# Patient Record
Sex: Female | Born: 1935 | Race: White | Hispanic: No | Marital: Married | Smoking: Never smoker
Health system: Southern US, Community
[De-identification: ages and names within clinical notes are randomized; demographics above are authoritative.]

## PROBLEM LIST (undated history)

## (undated) DIAGNOSIS — G459 Transient cerebral ischemic attack, unspecified: Secondary | ICD-10-CM

## (undated) DIAGNOSIS — I639 Cerebral infarction, unspecified: Secondary | ICD-10-CM

## (undated) DIAGNOSIS — C801 Malignant (primary) neoplasm, unspecified: Secondary | ICD-10-CM

## (undated) DIAGNOSIS — F0390 Unspecified dementia without behavioral disturbance: Secondary | ICD-10-CM

## (undated) HISTORY — PX: BREAST SURGERY: SHX581

## (undated) HISTORY — PX: ABDOMINAL HYSTERECTOMY: SHX81

## (undated) HISTORY — PX: CATARACT EXTRACTION: SUR2

---

## 2005-02-09 ENCOUNTER — Ambulatory Visit: Payer: Self-pay | Admitting: Internal Medicine

## 2005-03-17 ENCOUNTER — Ambulatory Visit: Payer: Self-pay | Admitting: Oncology

## 2006-03-24 ENCOUNTER — Ambulatory Visit: Payer: Self-pay | Admitting: Internal Medicine

## 2006-04-06 ENCOUNTER — Ambulatory Visit: Payer: Self-pay | Admitting: Oncology

## 2006-06-15 ENCOUNTER — Ambulatory Visit: Payer: Self-pay | Admitting: Gastroenterology

## 2007-03-28 ENCOUNTER — Ambulatory Visit: Payer: Self-pay | Admitting: Internal Medicine

## 2007-04-05 ENCOUNTER — Ambulatory Visit: Payer: Self-pay | Admitting: Oncology

## 2007-04-11 ENCOUNTER — Ambulatory Visit: Payer: Self-pay | Admitting: Oncology

## 2007-06-20 ENCOUNTER — Ambulatory Visit: Payer: Self-pay | Admitting: Internal Medicine

## 2008-03-11 ENCOUNTER — Ambulatory Visit: Payer: Self-pay | Admitting: Oncology

## 2008-04-01 ENCOUNTER — Ambulatory Visit: Payer: Self-pay | Admitting: Internal Medicine

## 2008-04-04 ENCOUNTER — Ambulatory Visit: Payer: Self-pay | Admitting: Oncology

## 2008-04-10 ENCOUNTER — Ambulatory Visit: Payer: Self-pay | Admitting: Oncology

## 2008-04-25 ENCOUNTER — Ambulatory Visit: Payer: Self-pay | Admitting: Vascular Surgery

## 2008-06-11 ENCOUNTER — Ambulatory Visit: Payer: Self-pay | Admitting: Radiation Oncology

## 2008-07-02 ENCOUNTER — Ambulatory Visit: Payer: Self-pay | Admitting: Surgery

## 2008-07-02 ENCOUNTER — Ambulatory Visit: Payer: Self-pay | Admitting: Cardiovascular Disease

## 2008-07-02 ENCOUNTER — Other Ambulatory Visit: Payer: Self-pay

## 2008-07-05 ENCOUNTER — Ambulatory Visit: Payer: Self-pay | Admitting: Surgery

## 2008-07-11 ENCOUNTER — Ambulatory Visit: Payer: Self-pay | Admitting: Oncology

## 2008-08-11 ENCOUNTER — Ambulatory Visit: Payer: Self-pay | Admitting: Oncology

## 2008-09-10 ENCOUNTER — Ambulatory Visit: Payer: Self-pay | Admitting: Oncology

## 2008-10-11 ENCOUNTER — Ambulatory Visit: Payer: Self-pay | Admitting: Oncology

## 2008-11-11 ENCOUNTER — Ambulatory Visit: Payer: Self-pay | Admitting: Oncology

## 2008-12-09 ENCOUNTER — Ambulatory Visit: Payer: Self-pay | Admitting: Oncology

## 2009-03-11 ENCOUNTER — Ambulatory Visit: Payer: Self-pay | Admitting: Oncology

## 2009-03-28 ENCOUNTER — Ambulatory Visit: Payer: Self-pay | Admitting: Oncology

## 2009-04-02 ENCOUNTER — Ambulatory Visit: Payer: Self-pay | Admitting: Internal Medicine

## 2009-04-10 ENCOUNTER — Ambulatory Visit: Payer: Self-pay | Admitting: Oncology

## 2009-05-11 ENCOUNTER — Ambulatory Visit: Payer: Self-pay | Admitting: Oncology

## 2009-06-05 ENCOUNTER — Ambulatory Visit: Payer: Self-pay | Admitting: Oncology

## 2009-06-11 ENCOUNTER — Ambulatory Visit: Payer: Self-pay | Admitting: Oncology

## 2009-09-10 ENCOUNTER — Ambulatory Visit: Payer: Self-pay | Admitting: Oncology

## 2009-09-17 ENCOUNTER — Ambulatory Visit: Payer: Self-pay | Admitting: Oncology

## 2009-09-24 ENCOUNTER — Ambulatory Visit: Payer: Self-pay | Admitting: Oncology

## 2009-10-11 ENCOUNTER — Ambulatory Visit: Payer: Self-pay | Admitting: Oncology

## 2010-03-11 ENCOUNTER — Ambulatory Visit: Payer: Self-pay | Admitting: Oncology

## 2010-03-25 ENCOUNTER — Ambulatory Visit: Payer: Self-pay | Admitting: Oncology

## 2010-04-08 ENCOUNTER — Ambulatory Visit: Payer: Self-pay | Admitting: Internal Medicine

## 2010-04-10 ENCOUNTER — Ambulatory Visit: Payer: Self-pay | Admitting: Oncology

## 2011-03-26 ENCOUNTER — Ambulatory Visit: Payer: Self-pay | Admitting: Oncology

## 2011-04-05 ENCOUNTER — Ambulatory Visit: Payer: Self-pay | Admitting: Oncology

## 2011-04-11 ENCOUNTER — Ambulatory Visit: Payer: Self-pay | Admitting: Oncology

## 2011-05-18 ENCOUNTER — Ambulatory Visit: Payer: Self-pay | Admitting: Internal Medicine

## 2011-10-14 ENCOUNTER — Ambulatory Visit: Payer: Self-pay | Admitting: Oncology

## 2011-10-14 LAB — CBC CANCER CENTER
Basophil %: 0.4 %
Eosinophil %: 4 %
HCT: 36.1 % (ref 35.0–47.0)
HGB: 12.4 g/dL (ref 12.0–16.0)
Lymphocyte %: 32.7 %
Monocyte %: 6.7 %
Neutrophil #: 3 x10 3/mm (ref 1.4–6.5)
Neutrophil %: 56.2 %
RBC: 3.74 10*6/uL — ABNORMAL LOW (ref 3.80–5.20)
RDW: 13.2 % (ref 11.5–14.5)
WBC: 5.3 x10 3/mm (ref 3.6–11.0)

## 2011-10-14 LAB — COMPREHENSIVE METABOLIC PANEL
Alkaline Phosphatase: 76 U/L (ref 50–136)
Calcium, Total: 9.6 mg/dL (ref 8.5–10.1)
Chloride: 104 mmol/L (ref 98–107)
Co2: 30 mmol/L (ref 21–32)
EGFR (African American): >60
EGFR (Non-African Amer.): 54 — ABNORMAL LOW
Glucose: 104 mg/dL — ABNORMAL HIGH (ref 65–99)
Osmolality: 281 (ref 275–301)
Sodium: 141 mmol/L (ref 136–145)

## 2011-11-12 ENCOUNTER — Ambulatory Visit: Payer: Self-pay | Admitting: Oncology

## 2012-03-10 ENCOUNTER — Ambulatory Visit: Payer: Self-pay | Admitting: Internal Medicine

## 2012-05-25 ENCOUNTER — Ambulatory Visit: Payer: Self-pay | Admitting: Oncology

## 2012-05-31 ENCOUNTER — Ambulatory Visit: Payer: Self-pay | Admitting: Internal Medicine

## 2012-06-15 ENCOUNTER — Ambulatory Visit: Payer: Self-pay | Admitting: Surgery

## 2012-06-22 ENCOUNTER — Ambulatory Visit: Payer: Self-pay | Admitting: Oncology

## 2012-07-07 LAB — PATHOLOGY REPORT

## 2012-07-11 ENCOUNTER — Ambulatory Visit: Payer: Self-pay | Admitting: Oncology

## 2012-07-24 ENCOUNTER — Ambulatory Visit: Payer: Self-pay | Admitting: Surgery

## 2012-07-24 LAB — CBC WITH DIFFERENTIAL/PLATELET
Basophil #: 0 10*3/uL (ref 0.0–0.1)
Eosinophil %: 2.4 %
HCT: 35.8 % (ref 35.0–47.0)
HGB: 12.6 g/dL (ref 12.0–16.0)
Lymphocyte #: 2 10*3/uL (ref 1.0–3.6)
Lymphocyte %: 27.5 %
MCV: 96 fL (ref 80–100)
Monocyte #: 0.4 x10 3/mm (ref 0.2–0.9)
Monocyte %: 5 %
Neutrophil #: 4.8 10*3/uL (ref 1.4–6.5)
Neutrophil %: 64.7 %
RBC: 3.75 10*6/uL — ABNORMAL LOW (ref 3.80–5.20)
RDW: 13.3 % (ref 11.5–14.5)
WBC: 7.4 10*3/uL (ref 3.6–11.0)

## 2012-07-24 LAB — BASIC METABOLIC PANEL
BUN: 10 mg/dL (ref 7–18)
Chloride: 108 mmol/L — ABNORMAL HIGH (ref 98–107)
EGFR (Non-African Amer.): 57 — ABNORMAL LOW
Glucose: 124 mg/dL — ABNORMAL HIGH (ref 65–99)
Osmolality: 285 (ref 275–301)

## 2012-08-08 ENCOUNTER — Ambulatory Visit: Payer: Self-pay | Admitting: Surgery

## 2012-08-09 LAB — CBC WITH DIFFERENTIAL/PLATELET
Basophil #: 0 10*3/uL (ref 0.0–0.1)
HGB: 10.3 g/dL — ABNORMAL LOW (ref 12.0–16.0)
Lymphocyte #: 1.7 10*3/uL (ref 1.0–3.6)
MCH: 33.9 pg (ref 26.0–34.0)
MCV: 96 fL (ref 80–100)
Monocyte #: 0.7 x10 3/mm (ref 0.2–0.9)
Neutrophil %: 76.1 %
Platelet: 83 10*3/uL — ABNORMAL LOW (ref 150–440)
RDW: 13 % (ref 11.5–14.5)
WBC: 10.3 10*3/uL (ref 3.6–11.0)

## 2012-08-10 LAB — CREATININE, SERUM: Creatinine: 0.84 mg/dL (ref 0.60–1.30)

## 2012-08-21 ENCOUNTER — Ambulatory Visit: Payer: Self-pay | Admitting: Oncology

## 2012-09-10 ENCOUNTER — Ambulatory Visit: Payer: Self-pay | Admitting: Oncology

## 2013-02-08 ENCOUNTER — Ambulatory Visit: Payer: Self-pay | Admitting: Oncology

## 2013-03-11 ENCOUNTER — Ambulatory Visit: Payer: Self-pay | Admitting: Oncology

## 2013-03-26 LAB — COMPREHENSIVE METABOLIC PANEL
Albumin: 3.8 g/dL (ref 3.4–5.0)
Anion Gap: 4 — ABNORMAL LOW (ref 7–16)
Bilirubin,Total: 0.5 mg/dL (ref 0.2–1.0)
Chloride: 104 mmol/L (ref 98–107)
Co2: 30 mmol/L (ref 21–32)
Creatinine: 1.06 mg/dL (ref 0.60–1.30)
EGFR (African American): 57 — ABNORMAL LOW
EGFR (Non-African Amer.): 49 — ABNORMAL LOW
Osmolality: 274 (ref 275–301)
SGOT(AST): 15 U/L (ref 15–37)
SGPT (ALT): 19 U/L (ref 12–78)
Sodium: 138 mmol/L (ref 136–145)
Total Protein: 7.2 g/dL (ref 6.4–8.2)

## 2013-03-26 LAB — CBC CANCER CENTER
Basophil %: 0.6 %
Eosinophil #: 0.5 x10 3/mm (ref 0.0–0.7)
Eosinophil %: 5.7 %
HCT: 38.4 % (ref 35.0–47.0)
Lymphocyte %: 30.6 %
MCH: 33.7 pg (ref 26.0–34.0)
MCHC: 34.9 g/dL (ref 32.0–36.0)
MCV: 96 fL (ref 80–100)
Monocyte %: 5.2 %
Neutrophil #: 5 x10 3/mm (ref 1.4–6.5)
Platelet: 105 x10 3/mm — ABNORMAL LOW (ref 150–440)
WBC: 8.7 x10 3/mm (ref 3.6–11.0)

## 2013-04-10 ENCOUNTER — Ambulatory Visit: Payer: Self-pay | Admitting: Oncology

## 2013-05-17 ENCOUNTER — Ambulatory Visit: Payer: Self-pay | Admitting: Physical Medicine and Rehabilitation

## 2013-05-29 ENCOUNTER — Ambulatory Visit: Payer: Self-pay | Admitting: Ophthalmology

## 2013-06-19 ENCOUNTER — Ambulatory Visit: Payer: Self-pay | Admitting: Ophthalmology

## 2013-09-26 ENCOUNTER — Ambulatory Visit: Payer: Self-pay | Admitting: Oncology

## 2013-09-26 LAB — COMPREHENSIVE METABOLIC PANEL
Albumin: 3.6 g/dL (ref 3.4–5.0)
Alkaline Phosphatase: 71 U/L
Anion Gap: 8 (ref 7–16)
BUN: 15 mg/dL (ref 7–18)
Bilirubin,Total: 0.5 mg/dL (ref 0.2–1.0)
Calcium, Total: 10.1 mg/dL (ref 8.5–10.1)
Creatinine: 1.01 mg/dL (ref 0.60–1.30)
EGFR (Non-African Amer.): 52 — ABNORMAL LOW
Glucose: 142 mg/dL — ABNORMAL HIGH (ref 65–99)
Osmolality: 283 (ref 275–301)
SGPT (ALT): 36 U/L (ref 12–78)
Sodium: 140 mmol/L (ref 136–145)
Total Protein: 7.5 g/dL (ref 6.4–8.2)

## 2013-09-26 LAB — CBC CANCER CENTER
Basophil #: 0 x10 3/mm (ref 0.0–0.1)
Basophil %: 0.2 %
HCT: 38.6 % (ref 35.0–47.0)
Lymphocyte #: 1.3 x10 3/mm (ref 1.0–3.6)
Lymphocyte %: 12.2 %
MCH: 32.8 pg (ref 26.0–34.0)
MCV: 98 fL (ref 80–100)
Neutrophil #: 8.9 x10 3/mm — ABNORMAL HIGH (ref 1.4–6.5)
Platelet: 59 x10 3/mm — ABNORMAL LOW (ref 150–440)
RBC: 3.95 10*6/uL (ref 3.80–5.20)
RDW: 13.7 % (ref 11.5–14.5)
WBC: 10.8 x10 3/mm (ref 3.6–11.0)

## 2013-10-11 ENCOUNTER — Ambulatory Visit: Payer: Self-pay | Admitting: Oncology

## 2013-11-07 LAB — CBC CANCER CENTER
BASOS ABS: 0.1 x10 3/mm (ref 0.0–0.1)
Basophil %: 0.6 %
Eosinophil #: 0.1 x10 3/mm (ref 0.0–0.7)
Eosinophil %: 1.5 %
HCT: 39.4 % (ref 35.0–47.0)
HGB: 13.2 g/dL (ref 12.0–16.0)
LYMPHS PCT: 25.6 %
Lymphocyte #: 2.2 x10 3/mm (ref 1.0–3.6)
MCH: 32.7 pg (ref 26.0–34.0)
MCHC: 33.4 g/dL (ref 32.0–36.0)
MCV: 98 fL (ref 80–100)
Monocyte #: 0.4 x10 3/mm (ref 0.2–0.9)
Monocyte %: 4.2 %
Neutrophil #: 5.9 x10 3/mm (ref 1.4–6.5)
Neutrophil %: 68.1 %
PLATELETS: 115 x10 3/mm — AB (ref 150–440)
RBC: 4.03 10*6/uL (ref 3.80–5.20)
RDW: 14.1 % (ref 11.5–14.5)
WBC: 8.7 x10 3/mm (ref 3.6–11.0)

## 2013-11-11 ENCOUNTER — Ambulatory Visit: Payer: Self-pay | Admitting: Oncology

## 2013-12-26 ENCOUNTER — Ambulatory Visit: Payer: Self-pay | Admitting: Oncology

## 2013-12-26 LAB — COMPREHENSIVE METABOLIC PANEL
ALT: 19 U/L (ref 12–78)
Albumin: 3.9 g/dL (ref 3.4–5.0)
Alkaline Phosphatase: 61 U/L
Anion Gap: 6 — ABNORMAL LOW (ref 7–16)
BUN: 18 mg/dL (ref 7–18)
Bilirubin,Total: 0.4 mg/dL (ref 0.2–1.0)
CREATININE: 1.01 mg/dL (ref 0.60–1.30)
Calcium, Total: 10.2 mg/dL — ABNORMAL HIGH (ref 8.5–10.1)
Chloride: 105 mmol/L (ref 98–107)
Co2: 31 mmol/L (ref 21–32)
EGFR (African American): >60
EGFR (Non-African Amer.): 52 — ABNORMAL LOW
Glucose: 132 mg/dL — ABNORMAL HIGH (ref 65–99)
Osmolality: 287 (ref 275–301)
POTASSIUM: 4.2 mmol/L (ref 3.5–5.1)
SGOT(AST): 14 U/L — ABNORMAL LOW (ref 15–37)
Sodium: 142 mmol/L (ref 136–145)
TOTAL PROTEIN: 7.1 g/dL (ref 6.4–8.2)

## 2013-12-26 LAB — CBC CANCER CENTER
BASOS PCT: 0.2 %
Basophil #: 0 x10 3/mm (ref 0.0–0.1)
Eosinophil #: 0 x10 3/mm (ref 0.0–0.7)
Eosinophil %: 0.1 %
HCT: 40.2 % (ref 35.0–47.0)
HGB: 13.3 g/dL (ref 12.0–16.0)
LYMPHS PCT: 24.7 %
Lymphocyte #: 2.2 x10 3/mm (ref 1.0–3.6)
MCH: 32.3 pg (ref 26.0–34.0)
MCHC: 33.1 g/dL (ref 32.0–36.0)
MCV: 98 fL (ref 80–100)
Monocyte #: 0.7 x10 3/mm (ref 0.2–0.9)
Monocyte %: 7.6 %
Neutrophil #: 6 x10 3/mm (ref 1.4–6.5)
Neutrophil %: 67.4 %
PLATELETS: 139 x10 3/mm — AB (ref 150–440)
RBC: 4.12 10*6/uL (ref 3.80–5.20)
RDW: 13.1 % (ref 11.5–14.5)
WBC: 8.9 x10 3/mm (ref 3.6–11.0)

## 2014-01-09 ENCOUNTER — Ambulatory Visit: Payer: Self-pay | Admitting: Oncology

## 2014-12-23 ENCOUNTER — Ambulatory Visit: Payer: Self-pay | Admitting: Internal Medicine

## 2015-01-21 ENCOUNTER — Ambulatory Visit
Admit: 2015-01-21 | Disposition: A | Discharge status: Home / Self Care | Payer: Self-pay | Attending: Oncology | Admitting: Oncology

## 2015-01-21 LAB — CBC CANCER CENTER
Basophil #: 0 x10 3/mm (ref 0.0–0.1)
Basophil %: 0.5 %
EOS ABS: 0.2 x10 3/mm (ref 0.0–0.7)
Eosinophil %: 3 %
HCT: 37.7 % (ref 35.0–47.0)
HGB: 12.6 g/dL (ref 12.0–16.0)
Lymphocyte #: 1.6 x10 3/mm (ref 1.0–3.6)
Lymphocyte %: 31 %
MCH: 31.8 pg (ref 26.0–34.0)
MCHC: 33.3 g/dL (ref 32.0–36.0)
MCV: 96 fL (ref 80–100)
MONO ABS: 0.3 x10 3/mm (ref 0.2–0.9)
Monocyte %: 6.5 %
Neutrophil #: 3 x10 3/mm (ref 1.4–6.5)
Neutrophil %: 59 %
PLATELETS: 71 x10 3/mm — AB (ref 150–440)
RBC: 3.95 10*6/uL (ref 3.80–5.20)
RDW: 13.9 % (ref 11.5–14.5)
WBC: 5.1 x10 3/mm (ref 3.6–11.0)

## 2015-01-21 LAB — COMPREHENSIVE METABOLIC PANEL
AST: 22 U/L
Albumin: 4 g/dL
Alkaline Phosphatase: 73 U/L
Anion Gap: 5 — ABNORMAL LOW (ref 7–16)
BUN: 14 mg/dL
Bilirubin,Total: 0.5 mg/dL
CALCIUM: 9.9 mg/dL
CO2: 28 mmol/L
Chloride: 105 mmol/L
Creatinine: 1.02 mg/dL — ABNORMAL HIGH
EGFR (Non-African Amer.): 50 — ABNORMAL LOW
GFR CALC AF AMER: 59 — AB
GLUCOSE: 110 mg/dL — AB
Potassium: 4.5 mmol/L
SGPT (ALT): 16 U/L
Sodium: 138 mmol/L
Total Protein: 6.5 g/dL

## 2015-01-28 NOTE — Discharge Summary (Signed)
PATIENT NAME:  Savannah, Lee MR#:  [id number] DATE OF BIRTH:  Jul 05, 1929  DATE OF ADMISSION:  08/08/2012 DATE OF DISCHARGE:  08/10/2012   BRIEF HISTORY: Savannah Lee is an 81 year old woman with recently discovered left breast carcinoma. She had a previously treated right breast carcinoma. She elected to proceed with bilateral mastectomy. She was taken to surgery on the morning of 08/08/2012 where she underwent a left simple mastectomy with an attempt at sentinel lymph node biopsy. Sentinel lymph node could not be identified, however, two large lymph nodes in the axilla were easily identified and sent for permanent section. A right simple mastectomy was then accomplished. She had no significant intraoperative complications. Postoperatively she had some mild problems with pain control and nausea. She was very unsteady on her feet. We elected to keep her over the course of the evening following surgery. The next day she was up ambulating better but still mildly nauseated and we observed her through a second evening. This morning she is up tolerating a regular diet. Her wounds look good. There is no sign of any infection. She has moderate drainage in her JP drains and will be sent home with her drains in place.   DISCHARGE MEDICATIONS:  1. Benadryl 25 mg p.o. at bedtime p.r.n.  2. Caltrate 600 mg p.o. b.i.d.  3. Centrum Silver one p.o. daily.  4. Vitamin B12 5000 mcg p.o. daily.  5. Diazepam 5 mg p.o. daily p.r.n.  6. She will also be discharged on Vicodin 325/5 mg p.o. p.r.n.   FINAL DISCHARGE DIAGNOSIS: Breast carcinoma.  SURGERY: Bilateral mastectomy and left axillary lymph node biopsy.  ____________________________ Rodena Goldmann III, MD rle:drc D: 08/10/2012 08:54:08 ET T: 08/10/2012 10:25:16 ET JOB#: [id number]  cc: Micheline Maze, MD, <Dictator> Leona Carry. Hall Busing, MD Rodena Goldmann MD ELECTRONICALLY SIGNED 08/11/2012 18:14

## 2015-01-28 NOTE — Op Note (Signed)
PATIENT NAME:  Savannah Lee, Savannah Lee MR#:  [id number] DATE OF BIRTH:  02/07/1929  DATE OF PROCEDURE:  08/08/2012  PREOPERATIVE DIAGNOSIS: Left breast carcinoma. Previous right breast carcinoma.   POSTOPERATIVE DIAGNOSIS: Left breast carcinoma. Previous right breast carcinoma.    PROCEDURE: Bilateral simple mastectomy, left axillary lymph node biopsy.   SURGEON: Rodena Goldmann, M.D.   ANESTHESIA: General.   OPERATIVE PROCEDURE: With the patient in the supine position after the induction of appropriate general anesthesia, the patient's chest was prepped with ChloraPrep and draped with sterile towels. The left side was approached first. Flaps were created in a fishmouth fashion around the nipple. Sutures using 3-0 silk were used to provide traction on the flaps and the superior flap created down to the second intercostal space and the chest wall. Inferior flap was created just down below the inframammary fold. The breast was then swept off the chest wall from the sternum to the latissimus muscle. The axilla was not entered. The axilla was interrogated with the Neoprobe. No significant activity could be identified in the axilla. One very large lymph node was identified just into the axilla and it was sent as a separate specimen but did not contain radionuclide material. There was some activity in the tail of the breast, but no specific lymph node could be identified. The drains were placed using 7 mm flat Jackson-Pratt drains through the inferior flap, one to the axilla and one to the superior flap. Drain was secured with 3-0 nylon. The skin was then closed with interrupted vertical mattress sutures of 4-0 nylon. A sterile drape was then placed over the wound. Operative team then reglowned, gloved and reprepped. The right side was then approached for a simple mastectomy. An elliptical incision was made around the breast, again in a fishmouth fashion. Sutures of 3-0 silk were used to secure the flaps for traction and  the superior flap created down to the second intercostal space and the inferior flap down just below the inframammary fold. The breast was again swept off the chest wall using Bovie electrocautery. Major perforating vessels were individually ligated with 3-0 Vicryl. The specimen was taken from the sternal edge to the latissimus dorsi muscle. The axilla was not entered. The specimen was marked and sent for permanent section. The area was copiously irrigated. Hemostasis appeared to be satisfactory. Two drains of a 7 mm flat Jackson-Pratt size were inserted through the inferior flap, placed one to the axilla and one to the superior flap. The skin was then secured with 4-0 nylon vertical mattress sutures and the drain was secured with 3-0 nylon. Sterile dressings were applied. The patient was returned to the recovery room having tolerated the procedure well. Sponge, instrument, and needle counts were correct x2 in the Operating Room.   ____________________________ Micheline Maze, MD rle:ap D: 08/08/2012 13:46:57 ET T: 08/08/2012 14:24:39 ET JOB#: [id number]  cc: Micheline Maze, MD, <Dictator> Leona Carry. Hall Busing, MD Rodena Goldmann MD ELECTRONICALLY SIGNED 08/11/2012 18:14

## 2015-01-31 NOTE — Op Note (Signed)
PATIENT NAME:  Savannah Lee, Savannah Lee MR#:  [id number] DATE OF BIRTH:  1929/08/30  DATE OF PROCEDURE:  06/19/2013  PREOPERATIVE DIAGNOSIS: Visually significant cataract of the left eye.   POSTOPERATIVE DIAGNOSIS: Visually significant cataract of the left eye.   OPERATIVE PROCEDURE: Cataract extraction by phacoemulsification with implant of intraocular lens to left eye.   SURGEON: Birder Robson, MD.   ANESTHESIA: 1. Managed anesthesia care.  2. Topical tetracaine drops followed by 2% Xylocaine jelly applied in the preoperative holding area.   COMPLICATIONS: None.   TECHNIQUE:  Stop and chop.    DESCRIPTION OF PROCEDURE: The patient was examined and consented in the preoperative holding area where the aforementioned topical anesthesia was applied to the left eye and then brought back to the Operating Room where the left eye was prepped and draped in the usual sterile ophthalmic fashion and a lid speculum was placed. A paracentesis was created with the side port blade and the anterior chamber was filled with viscoelastic. A near clear corneal incision was performed with the steel keratome. A continuous curvilinear capsulorrhexis was performed with a cystotome followed by the capsulorrhexis forceps. Hydrodissection and hydrodelineation were carried out with BSS on a blunt cannula. The lens was removed in a stop and chop  technique and the remaining cortical material was removed with the irrigation-aspiration handpiece. The capsular bag was inflated with viscoelastic and the Alcon SN60WF ZCB00, 23.0-diopter lens, serial number [id number] was placed in the capsular bag without complication. The remaining viscoelastic was removed from the eye with the irrigation-aspiration handpiece. The wounds were hydrated. The anterior chamber was flushed with Miostat and the eye was inflated to physiologic pressure. 0.1 mL of cefuroxime concentration 10 mg/mL was placed in the anterior chamber. The wounds were found to be  water tight. The eye was dressed with Vigamox. The patient was given protective glasses to wear throughout the day and a shield with which to sleep tonight. The patient was also given drops with which to begin a drop regimen today and will follow-up with me in 1 day.   ____________________________ Livingston Diones. Gertude Benito, MD wlp:cs D: 06/19/2013 17:34:01 ET T: 06/19/2013 20:02:04 ET JOB#: [id number]  cc: Michall Noffke L. Larue Lightner, MD, <Dictator> Livingston Diones Shataria Crist MD ELECTRONICALLY SIGNED 07/02/2013 16:54

## 2015-01-31 NOTE — Op Note (Signed)
PATIENT NAME:  Savannah Lee, Savannah Lee MR#:  [id number] DATE OF BIRTH:  1929-06-19  DATE OF PROCEDURE:  05/29/2013  PREOPERATIVE DIAGNOSIS: Visually significant cataract of the right eye.   POSTOPERATIVE DIAGNOSIS: Visually significant cataract of the right eye.   OPERATIVE PROCEDURE: Cataract extraction by phacoemulsification with implant of intraocular lens to right eye.   SURGEON: Birder Robson, MD.   ANESTHESIA:  1. Managed anesthesia care.  2. Topical tetracaine drops followed by 2% Xylocaine jelly applied in the preoperative holding area.   COMPLICATIONS: None.   TECHNIQUE:  Stop and chop.  DESCRIPTION OF PROCEDURE: The patient was examined and consented in the preoperative holding area where the aforementioned topical anesthesia was applied to the right eye and then brought back to the Operating Room where the right eye was prepped and draped in the usual sterile ophthalmic fashion and a lid speculum was placed. A paracentesis was created with the side port blade and the anterior chamber was filled with viscoelastic. A near clear corneal incision was performed with the steel keratome. A continuous curvilinear capsulorrhexis was performed with a cystotome followed by the capsulorrhexis forceps. Hydrodissection and hydrodelineation were carried out with BSS on a blunt cannula. The lens was removed in a stop and chop technique and the remaining cortical material was removed with the irrigation-aspiration handpiece. The capsular bag was inflated with viscoelastic and the TECNIS ZCB00 22.5-diopter lens, serial number [id number] was placed in the capsular bag without complication. The remaining viscoelastic was removed from the eye with the irrigation-aspiration handpiece. The wounds were hydrated. The anterior chamber was flushed with Miostat and the eye was inflated to physiologic pressure. 0.1 mL of cefuroxime concentration 10 mg/mL was placed in the anterior chamber. The wounds were found to be water  tight. The eye was dressed with Vigamox. The patient was given protective glasses to wear throughout the day and a shield with which to sleep tonight. The patient was also given drops with which to begin a drop regimen today and will follow-up with me in one day.   Please note, a single 10-0 nylon stitch was placed through the main incision at the end of the case due to iris prolapse in the last stages of the operation. The iris was gently reposited within the eye, and a stitch was placed. The patient was made aware of both the iris prolapse and the placement of the stitch.    ____________________________ Livingston Diones. Vanita Cannell, MD wlp:mr D: 05/29/2013 14:27:26 ET T: 05/29/2013 19:24:33 ET JOB#: [id number]  cc: Deronda Christian L. Odell Choung, MD, <Dictator> Livingston Diones Litzy Dicker MD ELECTRONICALLY SIGNED 05/30/2013 13:38

## 2015-08-12 ENCOUNTER — Emergency Department: Payer: Medicare Other

## 2015-08-12 ENCOUNTER — Inpatient Hospital Stay (HOSPITAL_COMMUNITY)
Admit: 2015-08-12 | Discharge: 2015-08-12 | Disposition: A | Discharge status: Home / Self Care | Payer: Medicare Other | Attending: Internal Medicine | Admitting: Internal Medicine

## 2015-08-12 ENCOUNTER — Inpatient Hospital Stay
Admission: EM | Admit: 2015-08-12 | Discharge: 2015-08-14 | DRG: 066 | Disposition: A | Discharge status: Home / Self Care | Payer: Medicare Other | Attending: Internal Medicine | Admitting: Internal Medicine

## 2015-08-12 ENCOUNTER — Encounter: Payer: Self-pay | Admitting: Emergency Medicine

## 2015-08-12 DIAGNOSIS — Z90710 Acquired absence of both cervix and uterus: Secondary | ICD-10-CM | POA: Diagnosis not present

## 2015-08-12 DIAGNOSIS — Z9849 Cataract extraction status, unspecified eye: Secondary | ICD-10-CM

## 2015-08-12 DIAGNOSIS — E785 Hyperlipidemia, unspecified: Secondary | ICD-10-CM | POA: Diagnosis present

## 2015-08-12 DIAGNOSIS — F418 Other specified anxiety disorders: Secondary | ICD-10-CM | POA: Diagnosis present

## 2015-08-12 DIAGNOSIS — I10 Essential (primary) hypertension: Secondary | ICD-10-CM | POA: Diagnosis present

## 2015-08-12 DIAGNOSIS — I517 Cardiomegaly: Secondary | ICD-10-CM | POA: Diagnosis not present

## 2015-08-12 DIAGNOSIS — F0390 Unspecified dementia without behavioral disturbance: Secondary | ICD-10-CM | POA: Diagnosis present

## 2015-08-12 DIAGNOSIS — I639 Cerebral infarction, unspecified: Secondary | ICD-10-CM | POA: Diagnosis present

## 2015-08-12 DIAGNOSIS — Z66 Do not resuscitate: Secondary | ICD-10-CM | POA: Diagnosis present

## 2015-08-12 DIAGNOSIS — Z809 Family history of malignant neoplasm, unspecified: Secondary | ICD-10-CM | POA: Diagnosis not present

## 2015-08-12 DIAGNOSIS — I671 Cerebral aneurysm, nonruptured: Secondary | ICD-10-CM | POA: Diagnosis present

## 2015-08-12 DIAGNOSIS — W06XXXA Fall from bed, initial encounter: Secondary | ICD-10-CM | POA: Diagnosis present

## 2015-08-12 DIAGNOSIS — R9389 Abnormal findings on diagnostic imaging of other specified body structures: Secondary | ICD-10-CM

## 2015-08-12 DIAGNOSIS — Z8673 Personal history of transient ischemic attack (TIA), and cerebral infarction without residual deficits: Secondary | ICD-10-CM | POA: Diagnosis not present

## 2015-08-12 DIAGNOSIS — G894 Chronic pain syndrome: Secondary | ICD-10-CM | POA: Diagnosis present

## 2015-08-12 DIAGNOSIS — Z79899 Other long term (current) drug therapy: Secondary | ICD-10-CM | POA: Diagnosis not present

## 2015-08-12 HISTORY — DX: Unspecified dementia, unspecified severity, without behavioral disturbance, psychotic disturbance, mood disturbance, and anxiety: F03.90

## 2015-08-12 HISTORY — DX: Transient cerebral ischemic attack, unspecified: G45.9

## 2015-08-12 HISTORY — DX: Malignant (primary) neoplasm, unspecified: C80.1

## 2015-08-12 LAB — CBC
HCT: 38.8 % (ref 35.0–47.0)
HEMOGLOBIN: 13.3 g/dL (ref 12.0–16.0)
MCH: 33.5 pg (ref 26.0–34.0)
MCHC: 34.2 g/dL (ref 32.0–36.0)
MCV: 98 fL (ref 80.0–100.0)
PLATELETS: 124 10*3/uL — AB (ref 150–440)
RBC: 3.96 MIL/uL (ref 3.80–5.20)
RDW: 13.8 % (ref 11.5–14.5)
WBC: 5.9 10*3/uL (ref 3.6–11.0)

## 2015-08-12 LAB — COMPREHENSIVE METABOLIC PANEL
ALK PHOS: 49 U/L (ref 38–126)
ALT: 15 U/L (ref 14–54)
ANION GAP: 8 (ref 5–15)
AST: 23 U/L (ref 15–41)
Albumin: 4.2 g/dL (ref 3.5–5.0)
BILIRUBIN TOTAL: 1 mg/dL (ref 0.3–1.2)
BUN: 8 mg/dL (ref 6–20)
CALCIUM: 10.1 mg/dL (ref 8.9–10.3)
CO2: 25 mmol/L (ref 22–32)
Chloride: 108 mmol/L (ref 101–111)
Creatinine, Ser: 0.88 mg/dL (ref 0.44–1.00)
GFR, EST NON AFRICAN AMERICAN: 59 mL/min — AB (ref >60)
Glucose, Bld: 105 mg/dL — ABNORMAL HIGH (ref 65–99)
Potassium: 3.5 mmol/L (ref 3.5–5.1)
Sodium: 141 mmol/L (ref 135–145)
TOTAL PROTEIN: 6.8 g/dL (ref 6.5–8.1)

## 2015-08-12 LAB — LIPID PANEL
CHOL/HDL RATIO: 3.6 ratio
Cholesterol: 226 mg/dL — ABNORMAL HIGH (ref 0–200)
HDL: 63 mg/dL (ref >40)
LDL CALC: 134 mg/dL — AB (ref 0–99)
Triglycerides: 146 mg/dL (ref <150)
VLDL: 29 mg/dL (ref 0–40)

## 2015-08-12 LAB — DIFFERENTIAL
Basophils Absolute: 0 10*3/uL (ref 0–0.1)
Basophils Relative: 0 %
EOS PCT: 1 %
Eosinophils Absolute: 0 10*3/uL (ref 0–0.7)
LYMPHS ABS: 1.8 10*3/uL (ref 1.0–3.6)
LYMPHS PCT: 30 %
MONO ABS: 0.4 10*3/uL (ref 0.2–0.9)
MONOS PCT: 7 %
Neutro Abs: 3.6 10*3/uL (ref 1.4–6.5)
Neutrophils Relative %: 62 %

## 2015-08-12 LAB — PROTIME-INR
INR: 0.93
PROTHROMBIN TIME: 12.7 s (ref 11.4–15.0)

## 2015-08-12 LAB — APTT: aPTT: 29 seconds (ref 24–36)

## 2015-08-12 MED ORDER — HYDROCODONE-ACETAMINOPHEN 7.5-325 MG PO TABS
0.5000 | ORAL_TABLET | Freq: Three times a day (TID) | ORAL | Status: DC | PRN
Start: 2015-08-12 — End: 2015-08-14

## 2015-08-12 MED ORDER — DIAZEPAM 5 MG PO TABS
5.0000 mg | ORAL_TABLET | Freq: Every day | ORAL | Status: DC | PRN
  Administered 2015-08-13 (×2): 5 mg via ORAL
  Filled 2015-08-12 (×2): qty 1

## 2015-08-12 MED ORDER — CALCIUM CITRATE-VITAMIN D 500-400 MG-UNIT PO CHEW
1.0000 | CHEWABLE_TABLET | Freq: Every day | ORAL | Status: DC
  Filled 2015-08-12: qty 1

## 2015-08-12 MED ORDER — CALCIUM CARBONATE-VITAMIN D 500-200 MG-UNIT PO TABS
1.0000 | ORAL_TABLET | Freq: Every day | ORAL | Status: DC
  Administered 2015-08-13 – 2015-08-14 (×2): 1 via ORAL
  Filled 2015-08-12 (×2): qty 1

## 2015-08-12 MED ORDER — DULOXETINE HCL 60 MG PO CPEP
60.0000 mg | ORAL_CAPSULE | Freq: Every day | ORAL | Status: DC
  Administered 2015-08-12 – 2015-08-13 (×2): 60 mg via ORAL
  Filled 2015-08-12 (×2): qty 1

## 2015-08-12 MED ORDER — ASPIRIN 325 MG PO TABS
325.0000 mg | ORAL_TABLET | Freq: Every day | ORAL | Status: DC
  Administered 2015-08-13 – 2015-08-14 (×2): 325 mg via ORAL
  Filled 2015-08-12 (×2): qty 1

## 2015-08-12 MED ORDER — ASPIRIN 81 MG PO CHEW
324.0000 mg | CHEWABLE_TABLET | Freq: Once | ORAL | Status: AC
  Administered 2015-08-12: 324 mg via ORAL
  Filled 2015-08-12: qty 4

## 2015-08-12 MED ORDER — ATORVASTATIN CALCIUM 20 MG PO TABS
40.0000 mg | ORAL_TABLET | Freq: Every day | ORAL | Status: DC
  Administered 2015-08-12 – 2015-08-13 (×2): 40 mg via ORAL
  Filled 2015-08-12 (×2): qty 2

## 2015-08-12 MED ORDER — DONEPEZIL HCL 5 MG PO TABS
5.0000 mg | ORAL_TABLET | Freq: Every evening | ORAL | Status: DC
  Administered 2015-08-12 – 2015-08-13 (×2): 5 mg via ORAL
  Filled 2015-08-12 (×2): qty 1

## 2015-08-12 NOTE — H&P (Signed)
[facility] NAME: Savannah Lee    MR#:  [id number]  DATE OF BIRTH:  1929-04-25  DATE OF ADMISSION:  08/12/2015  PRIMARY CARE PHYSICIAN: Dr. Benita Stabile.   REQUESTING/REFERRING PHYSICIAN: Harvest Dark  CHIEF COMPLAINT:   Chief Complaint  Patient presents with  . Numbness    HISTORY OF PRESENT ILLNESS: Savannah Lee  is a 81 y.o. female with a known history of dementia and TIA in the past who lives with husband and does not require any support, was completely fine at her baseline. Last night when she went to sleep. Today morning she woke up and wanted to go to the bathroom and had developed weakness on her right side of the body and so she was stumbling so decided to come to emergency room for that she also felt right-sided arm and leg both are numb and weak. Denies any other associated symptoms. CT scan in ER is negative for stroke. Her weakness is still persisted so ER physician started on admission for possible stroke.  PAST MEDICAL HISTORY:   Past Medical History  Diagnosis Date  . Dementia   . TIA (transient ischemic attack)     PAST SURGICAL HISTORY:  Past Surgical History  Procedure Laterality Date  . Abdominal hysterectomy    . Breast surgery    . Cataract extraction      SOCIAL HISTORY:  Social History  Substance Use Topics  . Smoking status: Never Smoker   . Smokeless tobacco: Not on file  . Alcohol Use: No    FAMILY HISTORY:  Family History  Problem Relation Age of Onset  . Cancer Father     DRUG ALLERGIES: No Known Allergies  REVIEW OF SYSTEMS:   CONSTITUTIONAL: No fever, fatigue or weakness.  EYES: No blurred or double vision.  EARS, NOSE, AND THROAT: No tinnitus or ear pain.  RESPIRATORY: No cough, shortness of breath, wheezing or hemoptysis.  CARDIOVASCULAR: No chest pain, orthopnea, edema.  GASTROINTESTINAL: No nausea, vomiting, diarrhea or abdominal pain.  GENITOURINARY: No dysuria,  hematuria.  ENDOCRINE: No polyuria, nocturia,  HEMATOLOGY: No anemia, easy bruising or bleeding SKIN: No rash or lesion. MUSCULOSKELETAL: No joint pain or arthritis.   NEUROLOGIC: No tingling, right side upper and lower extremity numbness & weakness.  PSYCHIATRY: No anxiety or depression.   MEDICATIONS AT HOME:  Prior to Admission medications   Medication Sig Start Date End Date Taking? Authorizing Provider  calcium citrate-vitamin D (CITRACAL+D) 315-200 MG-UNIT tablet Take 1 tablet by mouth daily.   Yes Historical Provider, MD  diazepam (VALIUM) 5 MG tablet Take 5 mg by mouth daily as needed for muscle spasms.    Yes Historical Provider, MD  diphenhydrAMINE (BENADRYL) 25 MG tablet Take 25 mg by mouth at bedtime as needed for allergies.    Yes Historical Provider, MD  donepezil (ARICEPT) 5 MG tablet Take 5 mg by mouth every evening.   Yes Historical Provider, MD  DULoxetine (CYMBALTA) 60 MG capsule Take 60 mg by mouth daily.   Yes Historical Provider, MD  HYDROcodone-acetaminophen (NORCO) 7.5-325 MG tablet Take 0.5-1 tablets by mouth 3 (three) times daily as needed for moderate pain or severe pain.    Yes Historical Provider, MD  Multiple Vitamins-Minerals (CENTRUM SILVER PO) Take 1 tablet by mouth daily.   Yes Historical Provider, MD  tizanidine (ZANAFLEX) 2 MG capsule Take 1-2 mg by mouth 3 (three) times daily.   Yes Historical Provider, MD  PHYSICAL EXAMINATION:   VITAL SIGNS: Blood pressure 134/76, pulse 74, temperature 98.2 F (36.8 C), temperature source Oral, resp. rate 17, height 5' (1.524 m), weight 46.267 kg (102 lb), SpO2 93 %.  GENERAL:  81 y.o.-year-old patient lying in the bed with no acute distress.  EYES: Pupils equal, round, reactive to light and accommodation. No scleral icterus. Extraocular muscles intact.  HEENT: Head atraumatic, normocephalic. Oropharynx and nasopharynx clear.  NECK:  Supple, no jugular venous distention. No thyroid enlargement, no tenderness.   LUNGS: Normal breath sounds bilaterally, no wheezing, rales,rhonchi or crepitation. No use of accessory muscles of respiration.  CARDIOVASCULAR: S1, S2 normal. No murmurs, rubs, or gallops.  ABDOMEN: Soft, nontender, nondistended. Bowel sounds present. No organomegaly or mass.  EXTREMITIES: No pedal edema, cyanosis, or clubbing.  NEUROLOGIC: Cranial nerves II through XII are intact. Muscle strength 5/5 in left side extremities, 3 to 4 out of 5 in right side extremities. Sensation intact. Gait not checked.  PSYCHIATRIC: The patient is alert and oriented x 3.  SKIN: No obvious rash, lesion, or ulcer.   LABORATORY PANEL:   CBC  Recent Labs Lab 08/12/15 1116  WBC 5.9  HGB 13.3  HCT 38.8  PLT 124*  MCV 98.0  MCH 33.5  MCHC 34.2  RDW 13.8  LYMPHSABS 1.8  MONOABS 0.4  EOSABS 0.0  BASOSABS 0.0   ------------------------------------------------------------------------------------------------------------------  Chemistries   Recent Labs Lab 08/12/15 1116  NA 141  K 3.5  CL 108  CO2 25  GLUCOSE 105*  BUN 8  CREATININE 0.88  CALCIUM 10.1  AST 23  ALT 15  ALKPHOS 49  BILITOT 1.0   ------------------------------------------------------------------------------------------------------------------ estimated creatinine clearance is 34.2 mL/min (by C-G formula based on Cr of 0.88). ------------------------------------------------------------------------------------------------------------------ No results for input(s): TSH, T4TOTAL, T3FREE, THYROIDAB in the last 72 hours.  Invalid input(s): FREET3   Coagulation profile  Recent Labs Lab 08/12/15 1116  INR 0.93   ------------------------------------------------------------------------------------------------------------------- No results for input(s): DDIMER in the last 72 hours. -------------------------------------------------------------------------------------------------------------------  Cardiac Enzymes No  results for input(s): CKMB, TROPONINI, MYOGLOBIN in the last 168 hours.  Invalid input(s): CK ------------------------------------------------------------------------------------------------------------------ Invalid input(s): POCBNP  ---------------------------------------------------------------------------------------------------------------  Urinalysis No results found for: COLORURINE, APPEARANCEUR, LABSPEC, PHURINE, GLUCOSEU, HGBUR, BILIRUBINUR, KETONESUR, PROTEINUR, UROBILINOGEN, NITRITE, LEUKOCYTESUR   RADIOLOGY: Ct Head Wo Contrast  08/12/2015  CLINICAL DATA:  Fall. Right leg weakness and numbness. Right arm numbness. EXAM: CT HEAD WITHOUT CONTRAST TECHNIQUE: Contiguous axial images were obtained from the base of the skull through the vertex without intravenous contrast. COMPARISON:  03/10/2012 FINDINGS: There is atrophy and chronic small vessel disease changes. No acute intracranial abnormality. Specifically, no hemorrhage, hydrocephalus, mass lesion, acute infarction, or significant intracranial injury. No acute calvarial abnormality. Visualized paranasal sinuses and mastoids clear. Orbital soft tissues unremarkable. IMPRESSION: No acute intracranial abnormality. Atrophy, chronic microvascular disease. Electronically Signed   By: Rolm Baptise M.D.   On: 08/12/2015 12:10    EKG:  NSR  IMPRESSION AND PLAN:  * CVA  His patient woke up with the symptoms in the morning she is out of the window period for any thrombolytic treatments. Will admit and monitor on telemetry, do frequent neuro checks.  She still have persistent weakness on right side, so we will get physical therapy for further evaluation.  Get an MRI of the brain, carotid Doppler, echocardiogram for further assessment.  Check lipid panel, and will give aspirin and statin for now.  * Dementia  Continue home medications  * Chronic pains  Continue her  home pain medications as needed.    All the records are reviewed  and case discussed with ED provider. Management plans discussed with the patient, family and they are in agreement.  CODE STATUS: DO NOT RESUSCITATE  The patient's husband is in the room I discuss about her healthcare power of attorney and her wishes CPR in his presence. I also discussed about her current possibility of stroke and need for admission and further workup stay in agreement with the plan.   TOTAL TIME TAKING CARE OF THIS PATIENT: 50 minutes.    Vaughan Basta M.D on 08/12/2015   Between 7am to 6pm - Pager - [phone]  After 6pm go to [email or web address] - password [email or web address] [email or web address]  [facility]  Office  [phone]  CC: Primary care physician; No primary care provider on file.   Note: This dictation was prepared with Dragon dictation along with smaller phrase technology. Any transcriptional errors that result from this process are unintentional.

## 2015-08-12 NOTE — ED Provider Notes (Signed)
[facility] Emergency Department Provider Note  Time seen: 12:17 PM  I have reviewed the triage vital signs and the nursing notes.   HISTORY  Chief Complaint Numbness    HPI Savannah Lee is a 82 y.o. female with no past medical history who presents to the emergency department with a right leg and arm weakness. According to the patient she awoke at 8:30 this morning, she attempted to get out of bed and fell to the ground. Her right arm and right leg felt very weak. Her last known normal was last night before going to bed. Denies any headache. States her arm and leg continue to feel weak. In her arm is tingling. Describes her weakness as moderate, tingling is moderate.     History reviewed. No pertinent past medical history.  There are no active problems to display for this patient.   Past Surgical History  Procedure Laterality Date  . Abdominal hysterectomy    . Breast surgery    . Cataract extraction      Current Outpatient Rx  Name  Route  Sig  Dispense  Refill  . calcium citrate-vitamin D (CITRACAL+D) 315-200 MG-UNIT tablet   Oral   Take 1 tablet by mouth daily.         . diazepam (VALIUM) 5 MG tablet   Oral   Take 5 mg by mouth daily as needed for muscle spasms.          . diphenhydrAMINE (BENADRYL) 25 MG tablet   Oral   Take 25 mg by mouth at bedtime as needed for allergies.          Marland Kitchen donepezil (ARICEPT) 5 MG tablet   Oral   Take 5 mg by mouth every evening.         . DULoxetine (CYMBALTA) 60 MG capsule   Oral   Take 60 mg by mouth daily.         Marland Kitchen HYDROcodone-acetaminophen (NORCO) 7.5-325 MG tablet   Oral   Take 0.5-1 tablets by mouth 3 (three) times daily as needed for moderate pain or severe pain.          . Multiple Vitamins-Minerals (CENTRUM SILVER PO)   Oral   Take 1 tablet by mouth daily.         . tizanidine (ZANAFLEX) 2 MG capsule   Oral   Take 1-2 mg by mouth 3 (three) times daily.            Allergies Review of patient's allergies indicates no known allergies.  No family history on file.  Social History Social History  Substance Use Topics  . Smoking status: Never Smoker   . Smokeless tobacco: None  . Alcohol Use: No    Review of Systems Constitutional: Negative for fever. Cardiovascular: Negative for chest pain. Respiratory: Negative for shortness of breath. Gastrointestinal: Negative for abdominal pain Musculoskeletal: Negative for back pain. Negative for neck pain. Neurological: Negative for headache. Weakness in her right arm and right leg. Tingling in her right arm. 10-point ROS otherwise negative.  ____________________________________________   PHYSICAL EXAM:  VITAL SIGNS: ED Triage Vitals  Enc Vitals Group     BP 08/12/15 1046 148/87 mmHg     Pulse Rate 08/12/15 1046 92     Resp 08/12/15 1046 18     Temp 08/12/15 1046 98.2 F (36.8 C)     Temp Source 08/12/15 1046 Oral     SpO2 08/12/15 1046 98 %  Weight 08/12/15 1046 102 lb (46.267 kg)     Height 08/12/15 1046 5' (1.524 m)     Head Cir --      Peak Flow --      Pain Score --      Pain Loc --      Pain Edu? --      Excl. in [place]? --     Constitutional: Alert and oriented. Well appearing and in no distress. Eyes: Normal exam ENT   Head: Normocephalic and atraumatic   Mouth/Throat: Mucous membranes are moist. Cardiovascular: Normal rate, regular rhythm. No murmur Respiratory: Normal respiratory effort without tachypnea nor retractions. Breath sounds are clear  Gastrointestinal: Soft and nontender. No distention.  Musculoskeletal: Nontender with normal range of motion in all extremities. Neurologic:  Normal speech and language. No facial droop. Cranial nerves intact. Moderate pronator drift of the right upper extremity. Subjective sensory deficit in the right upper extremity. Mild lower right extremity drift, no sensory deficit. Skin:  Skin is warm, dry and intact.  Psychiatric:  Mood and affect are normal. Speech and behavior are normal.   ____________________________________________    EKG  EKG reviewed and interpreted by myself shows normal sinus rhythm at 72 bpm, narrow QRS, normal axis, normal intervals, no ST changes noted. Overall normal EKG.  ____________________________________________    RADIOLOGY  CT shows no acute abnormality  ____________________________________________   INITIAL IMPRESSION / ASSESSMENT AND PLAN / ED COURSE  Pertinent labs & imaging results that were available during my care of the patient were reviewed by me and considered in my medical decision making (see chart for details).  Patient with signs and symptoms most suggestive of a stroke. Continued right and lower extremity drift. CT negative. Labs are largely within normal limits. We'll admit to the hospital for acute CVA. Dose aspirin in the emergency department.   NIH Stroke Scale    Time: 12:21 PM Person Administering Scale: Ashleigh Luckow  Administer stroke scale items in the order listed. Record performance in each category after each subscale exam. Do not go back and change scores. Follow directions provided for each exam technique. Scores should reflect what the patient does, not what the clinician thinks the patient can do. The clinician should record answers while administering the exam and work quickly. Except where indicated, the patient should not be coached (i.e., repeated requests to patient to make a special effort).   1a  Level of consciousness: 0=alert; keenly responsive  1b. LOC questions:  0=Performs both tasks correctly  1c. LOC commands: 0=Performs both tasks correctly  2.  Best Gaze: 0=normal  3.  Visual: 0=No visual loss  4. Facial Palsy: 0=Normal symmetric movement  5a.  Motor left arm: 0=No drift, limb holds 90 (or 45) degrees for full 10 seconds  5b.  Motor right arm: 1=Drift, limb holds 90 (or 45) degrees but drifts down before full 10  seconds: does not hit bed  6a. motor left leg: 0=No drift, limb holds 90 (or 45) degrees for full 10 seconds  6b  Motor right leg:  1=Drift, limb holds 90 (or 45) degrees but drifts down before full 10 seconds: does not hit bed  7. Limb Ataxia: 2=Present in two limbs  8.  Sensory: 1=Mild to moderate sensory loss; patient feels pinprick is less sharp or is dull on the affected side; there is a loss of superficial pain with pinprick but patient is aware She is being touched  9. Best Language:  0=No aphasia, normal  10. Dysarthria: 0=Normal  11. Extinction and Inattention: 0=No abnormality  12. Distal motor function: 0=Normal   Total:   5    ____________________________________________   FINAL CLINICAL IMPRESSION(S) / ED DIAGNOSES  Acute cerebrovascular accident   Harvest Dark, MD 08/12/15 1222

## 2015-08-12 NOTE — Evaluation (Signed)
Physical Therapy Evaluation Patient Details Name: Savannah Lee MRN: [id number] DOB: 12-29-1928 Today's Date: 08/12/2015   History of Present Illness  presented to ER with onset of R sided weakness/numbness; admitted for CVA work-up.  CT head negative for acute change; MRI pending.  Clinical Impression  Upon evaluation, patient alert and oriented; very pleasant and cooperative.  Eager for all mobility attempts.  Demonstrates fair strength in R UE/LE (4/5), though decreased compared to L hemi-body.  Mod coordination and fine motor deficits noted (UE > LE) with decreased sensory awareness noted with functional activities.  Patient somewhat impulsive with limited insight into deficits.  Currently requiring min assist for all functional mobility; minimally changed with use of RW (may consider trial of SPC vs. loftstrand in subsequent sessions).  Occasional LOB to R with all standing/mobiltiy requiring min assist form therapist for correction.  Would benefit from skilled PT to address above deficits and promote optimal return to PLOF; anticipate discharge to home with HHPT (vs. STR), but will assess stairs next session prior to full recommendation.    Follow Up Recommendations Home health PT;SNF (pending stair assessment next session)    Equipment Recommendations       Recommendations for Other Services       Precautions / Restrictions Precautions Precautions: Fall Restrictions Weight Bearing Restrictions: No      Mobility  Bed Mobility Overal bed mobility: Modified Independent                Transfers Overall transfer level: Needs assistance   Transfers: Sit to/from Stand Sit to Stand: Min assist         General transfer comment: impulsive  Ambulation/Gait Ambulation/Gait assistance: Min assist Ambulation Distance (Feet): 120 Feet Assistive device: None       General Gait Details: broad BOS, slightly staggering with R LE.  Decreased heel strike/toe off and overall  foot clearance to R LE; decreased R arm swing.  Min assist for balance/stability with dynamic gait components.  Stairs            Wheelchair Mobility    Modified Rankin (Stroke Patients Only)       Balance Overall balance assessment: Needs assistance Sitting-balance support: No upper extremity supported;Feet supported Sitting balance-Leahy Scale: Good     Standing balance support: No upper extremity supported Standing balance-Leahy Scale: Fair                               Pertinent Vitals/Pain Pain Assessment: No/denies pain    Home Living Family/patient expects to be discharged to:: Private residence Living Arrangements: Spouse/significant other Available Help at Discharge: Family Type of Home: House Home Access: Stairs to enter Entrance Stairs-Rails: Right Entrance Stairs-Number of Steps: 4 Home Layout: Two level;Bed/bath upstairs (has bed/bathroom on main level of home if needed) Home Equipment: None      Prior Function Level of Independence: Independent         Comments: Indep with all household/community mobility; + driving.  Denies fall history     Hand Dominance   Dominant Hand: Right    Extremity/Trunk Assessment   Upper Extremity Assessment:  (R UE strength grossly 4/5.  Decreased coordination and fine motor control; mild dysmetria and ataxia.  Decreased sensory awareness to light touch, but intact proprioception with no extinction noted.)           Lower Extremity Assessment:  (R LE strength grossly 4/5.  Decreased coordination and  RAMPS.  Decreased sensory awareness to light touch, but intact proprioception with no extinction noted.  Negative Babinski and clonus R ankle)         Communication   Communication: No difficulties  Cognition Arousal/Alertness: Awake/alert Behavior During Therapy: Impulsive Overall Cognitive Status: Within Functional Limits for tasks assessed                      General Comments       Exercises Other Exercises Other Exercises: 15' with RW, cga/min assist--poor walker position and management; patient constantly voicing dislike of RW (? greater barrier vs. help?).  May benefit from trial of loftstrand vs. SPC if any device is fully necessary.      Assessment/Plan    PT Assessment Patient needs continued PT services  PT Diagnosis Difficulty walking;Hemiplegia dominant side;Generalized weakness   PT Problem List Decreased strength;Decreased range of motion;Decreased activity tolerance;Decreased balance;Decreased mobility;Decreased coordination;Decreased knowledge of use of DME;Decreased safety awareness;Decreased knowledge of precautions;Impaired sensation  PT Treatment Interventions DME instruction;Gait training;Stair training;Functional mobility training;Therapeutic activities;Therapeutic exercise;Balance training;Neuromuscular re-education;Cognitive remediation;Patient/family education   PT Goals (Current goals can be found in the Care Plan section) Acute Rehab PT Goals Patient Stated Goal: to move around a little bit PT Goal Formulation: With patient Time For Goal Achievement: 08/26/15 Potential to Achieve Goals: Good    Frequency 7X/week   Barriers to discharge        Co-evaluation               End of Session Equipment Utilized During Treatment: Gait belt Activity Tolerance: Patient tolerated treatment well Patient left: in chair;with call bell/phone within reach;with chair alarm set (personal clip alarm applied (box not available); RN informed/aware) Nurse Communication: Mobility status         Time: 5498-2641 PT Time Calculation (min) (ACUTE ONLY): 23 min   Charges:     PT Treatments $Gait Training: 8-22 mins   PT G Codes:        Chelci Wintermute H. Owens Shark, PT, DPT, NCS 08/12/2015, 5:38 PM [phone]

## 2015-08-12 NOTE — ED Notes (Addendum)
Pt states she woke up this am at 0830 and fell to the ground due to weakness in right leg, c/o right leg weakness and numbness with numbness to right arm equal grip strength, some drift to right arm noted, smile symmetrical, states things were normal when she went to bed last night, per MD pt in not considered a code stroke at this time

## 2015-08-12 NOTE — Progress Notes (Signed)
*  PRELIMINARY RESULTS* Echocardiogram 2D Echocardiogram has been performed.  Lavell Luster Stills 08/12/2015, 5:30 PM

## 2015-08-13 ENCOUNTER — Inpatient Hospital Stay: Payer: Medicare Other

## 2015-08-13 ENCOUNTER — Encounter: Payer: Self-pay | Admitting: Radiology

## 2015-08-13 LAB — CBC
HEMATOCRIT: 36.9 % (ref 35.0–47.0)
Hemoglobin: 12.7 g/dL (ref 12.0–16.0)
MCH: 33.7 pg (ref 26.0–34.0)
MCHC: 34.5 g/dL (ref 32.0–36.0)
MCV: 97.7 fL (ref 80.0–100.0)
Platelets: 128 10*3/uL — ABNORMAL LOW (ref 150–440)
RBC: 3.77 MIL/uL — ABNORMAL LOW (ref 3.80–5.20)
RDW: 14 % (ref 11.5–14.5)
WBC: 6.1 10*3/uL (ref 3.6–11.0)

## 2015-08-13 LAB — BASIC METABOLIC PANEL
Anion gap: 3 — ABNORMAL LOW (ref 5–15)
BUN: 9 mg/dL (ref 6–20)
CHLORIDE: 115 mmol/L — AB (ref 101–111)
CO2: 24 mmol/L (ref 22–32)
Calcium: 9.9 mg/dL (ref 8.9–10.3)
Creatinine, Ser: 0.82 mg/dL (ref 0.44–1.00)
GFR calc Af Amer: >60 mL/min (ref >60)
GFR calc non Af Amer: >60 mL/min (ref >60)
GLUCOSE: 88 mg/dL (ref 65–99)
POTASSIUM: 3.8 mmol/L (ref 3.5–5.1)
Sodium: 142 mmol/L (ref 135–145)

## 2015-08-13 MED ORDER — IOHEXOL 350 MG/ML SOLN
80.0000 mL | Freq: Once | INTRAVENOUS | Status: AC | PRN
  Administered 2015-08-13: 80 mL via INTRAVENOUS

## 2015-08-13 NOTE — Care Management (Signed)
Admitted to [facility] with the diagnosis of CVA, Lives with husband, Carrolyn Meiers, [phone]).  Last been to Dr. Juanell Fairly office for a flu shot a week ago and for a physical about a month ago. No home Health. No skilled facility. No home oxygen. Uses no aids for ambulation. Takes care of both basic and instrumental activities of daily living herself. One fall about a year ago over a tree stump. Decreased appetite.  Physical therapy evaluation completed. Recommends Home with Home Health, physical therapy, and occupational therapy. Will need rolling walker. Discussed home health agencies. Would like [facility] for therapies and rolling walker. Shelbie Ammons RN MSN CCM Care Management [phone]

## 2015-08-13 NOTE — Plan of Care (Signed)
Problem: Education: Goal: Knowledge of [facility] Education information/materials will improve Individualization  Plan of care progress to goal: - NIH=1 - Valium given x1 for muscle spasm with improvement. - Assist in room. Will continue to monitor.

## 2015-08-13 NOTE — Progress Notes (Signed)
Patient ID: Savannah Lee, female   DOB: 10-Oct-1929, 81 y.o.   MRN: [id number] [facility] PROGRESS NOTE  PCP: No primary care provider on file.  HPI/Subjective: She came in with right sided weakness. And thought she had a stroke. MRI confirmed acute stroke. Patient states that her right leg still feels a little bit weak but her right arm is stronger again.  Objective: Filed Vitals:   08/13/15 0556  BP: 157/73  Pulse: 74  Temp: 98.4 F (36.9 C)  Resp: 16    Filed Weights   08/12/15 1046  Weight: 46.267 kg (102 lb)    ROS: Review of Systems  Constitutional: Negative for fever and chills.  Eyes: Negative for blurred vision.  Respiratory: Negative for cough and shortness of breath.   Cardiovascular: Negative for chest pain.  Gastrointestinal: Negative for nausea, vomiting, abdominal pain, diarrhea and constipation.  Genitourinary: Negative for dysuria.  Musculoskeletal: Negative for joint pain.  Neurological: Positive for focal weakness. Negative for dizziness and headaches.   Exam: Physical Exam  Constitutional: She is oriented to person, place, and time.  HENT:  Nose: No mucosal edema.  Mouth/Throat: No oropharyngeal exudate or posterior oropharyngeal edema.  Eyes: Conjunctivae, EOM and lids are normal. Pupils are equal, round, and reactive to light.  Neck: No JVD present. Carotid bruit is not present. No edema present. No thyroid mass and no thyromegaly present.  Cardiovascular: S1 normal and S2 normal.  Exam reveals no gallop.   No murmur heard. Pulses:      Dorsalis pedis pulses are 2+ on the right side, and 2+ on the left side.  Respiratory: No respiratory distress. She has no wheezes. She has no rhonchi. She has no rales.  GI: Soft. Bowel sounds are normal. There is no tenderness.  Musculoskeletal:       Right ankle: She exhibits no swelling.       Left ankle: She exhibits no swelling.  Lymphadenopathy:    She has no cervical adenopathy.   Neurological: She is alert and oriented to person, place, and time. No cranial nerve deficit.  Power 5 out of 5 bilateral upper and lower extremities  Skin: Skin is warm. No rash noted. Nails show no clubbing.  Chronic lower extremity discoloration and also some scab seen.  Psychiatric: She has a normal mood and affect.   Data Reviewed: Basic Metabolic Panel:  Recent Labs Lab 08/12/15 1116 08/13/15 0457  NA 141 142  K 3.5 3.8  CL 108 115*  CO2 25 24  GLUCOSE 105* 88  BUN 8 9  CREATININE 0.88 0.82  CALCIUM 10.1 9.9   Liver Function Tests:  Recent Labs Lab 08/12/15 1116  AST 23  ALT 15  ALKPHOS 49  BILITOT 1.0  PROT 6.8  ALBUMIN 4.2   CBC:  Recent Labs Lab 08/12/15 1116 08/13/15 0457  WBC 5.9 6.1  NEUTROABS 3.6  --   HGB 13.3 12.7  HCT 38.8 36.9  MCV 98.0 97.7  PLT 124* 128*     Studies: Ct Head Wo Contrast  08/12/2015  CLINICAL DATA:  Fall. Right leg weakness and numbness. Right arm numbness. EXAM: CT HEAD WITHOUT CONTRAST TECHNIQUE: Contiguous axial images were obtained from the base of the skull through the vertex without intravenous contrast. COMPARISON:  03/10/2012 FINDINGS: There is atrophy and chronic small vessel disease changes. No acute intracranial abnormality. Specifically, no hemorrhage, hydrocephalus, mass lesion, acute infarction, or significant intracranial injury. No acute calvarial abnormality. Visualized paranasal sinuses and mastoids  clear. Orbital soft tissues unremarkable. IMPRESSION: No acute intracranial abnormality. Atrophy, chronic microvascular disease. Electronically Signed   By: Rolm Baptise M.D.   On: 08/12/2015 12:10   Mr Brain Wo Contrast  08/13/2015  CLINICAL DATA:  TIA. Dementia. Right-sided weakness since yesterday morning. EXAM: MRI HEAD WITHOUT CONTRAST TECHNIQUE: Multiplanar, multiecho pulse sequences of the brain and surrounding structures were obtained without intravenous contrast. COMPARISON:  CT head 08/12/2015,  03/10/2012, MRI 07/16/2008 FINDINGS: Acute infarct left thalamus measuring 12 mm. No other areas of acute infarction. Chronic microvascular ischemic change in the white matter and pons bilaterally. Chronic infarcts in the cerebellum bilaterally. Moderate atrophy.  Ventricular enlargement consistent with atrophy. Possible chronic hemorrhage in the left inferior cerebellum. No fluid collection. No other areas of hemorrhage. Well-circumscribed mass lesion in the left parapharyngeal space measures 14 x 20 mm. This has low signal on T1 and T2 and is suggestive of flow void from an aneurysm. Small fleck of calcium in the wall of this lesion is seen on prior CT. This was present on the MRI of 2009. Other lesions that can occur in this space include salivary tumor and nerve sheath tumor. Asymmetric low signal below the skullbase on the right may be tortuous right jugular vein which is relatively large in dominant. Small left jugular vein noted. IMPRESSION: Acute infarct left thalamus. Atrophy and chronic ischemia 14 x 20 mm mass left parapharyngeal space. 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 Neoplasm such as nerve sheath tumor and salivary gland tumor considered less likely. This appears to be a chronic finding and was incompletely imaged in 2009. Consider follow-up CTA of the head and neck for further evaluation. Electronically Signed   By: Franchot Gallo M.D.   On: 08/13/2015 10:32   US Carotid Bilateral  08/13/2015  CLINICAL DATA:  CVA. EXAM: BILATERAL CAROTID DUPLEX ULTRASOUND TECHNIQUE: Pearline Cables scale imaging, color Doppler and duplex ultrasound were performed of bilateral carotid and vertebral arteries in the neck. COMPARISON:  None. FINDINGS: Criteria: Quantification of carotid stenosis is based on velocity parameters that correlate the residual internal carotid diameter with NASCET-based stenosis levels, using the diameter of the distal internal carotid lumen as the denominator for stenosis  measurement. The following velocity measurements were obtained: RIGHT ICA:  59/15 cm/sec CCA:  15/17 cm/sec SYSTOLIC ICA/CCA RATIO:  1.0 DIASTOLIC ICA/CCA RATIO:  1.0 ECA:  71 cm/sec LEFT ICA:  72/16 cm/sec CCA:  61/60 cm/sec SYSTOLIC ICA/CCA RATIO:  1.0 DIASTOLIC ICA/CCA RATIO:  1.6 ECA:  48 cm/sec RIGHT CAROTID ARTERY: There is a moderate amount of eccentric mixed echogenic plaque within distal aspect the right common carotid artery (image 10). There are no elevated peak systolic velocities within the interrogated course of the right internal carotid artery to suggest a hemodynamically significant stenosis. RIGHT VERTEBRAL ARTERY:  Antegrade flow LEFT CAROTID ARTERY: There is a minimal amount of eccentric mixed echogenic plaque within the mid and distal aspects of the left common carotid artery (images 40, 41, 44 and 45). There is a moderate to large amount of eccentric mixed echogenic plaque within the left carotid bulb (images 49 and 50), not resulting in elevated peak systolic velocities within the interrogated course of the left internal carotid artery to suggest a hemodynamically significant stenosis. LEFT VERTEBRAL ARTERY:  Antegrade flow IMPRESSION: Moderate to large amount of bilateral atherosclerotic plaque, left greater than right, not resulting in hemodynamically significant stenosis within either internal carotid artery. Electronically Signed   By: Eldridge Abrahams.D.  On: 08/13/2015 13:04    Scheduled Meds: . aspirin  325 mg Oral Daily  . atorvastatin  40 mg Oral q1800  . calcium-vitamin D  1 tablet Oral Daily  . donepezil  5 mg Oral QPM  . DULoxetine  60 mg Oral Daily    Assessment/Plan:  1. Acute stroke in the left thalamus with right-sided weakness. Patient is aspirin nave so aspirin will be continued. Add atorvastatin 40 mg daily. PT OT consultations. Likely home with home health tomorrow. 2. Abnormal MRI of the brain showing a mass in the parapharyngeal space- CT angioma head and  neck for further evaluation. 3. Hyperlipidemia unspecified continue atorvastatin for LDL of 134. 4. History of dementia continue Aricept 5. Anxiety depression on Celexa  Code Status:     Code Status Orders        Start     Ordered   08/12/15 1410  Do not attempt resuscitation (DNR)   Continuous    Question Answer Comment  In the event of cardiac or respiratory ARREST Do not call a "code blue"   In the event of cardiac or respiratory ARREST Do not perform Intubation, CPR, defibrillation or ACLS   In the event of cardiac or respiratory ARREST Use medication by any route, position, wound care, and other measures to relive pain and suffering. May use oxygen, suction and manual treatment of airway obstruction as needed for comfort.      08/12/15 1409    Advance Directive Documentation        Most Recent Value   Type of Advance Directive  Healthcare Power of Attorney   Pre-existing out of facility DNR order (yellow form or pink MOST form)     "MOST" Form in Place?       Family Communication: Family at bedside Disposition Plan: Likely home tomorrow  Time spent: 30 minutes  Loletha Grayer  [facility]

## 2015-08-13 NOTE — Progress Notes (Signed)
Physical Therapy Treatment Patient Details Name: Savannah Lee MRN: [id number] DOB: 11-23-1928 Today's Date: 08/13/2015    History of Present Illness presented to ER with onset of R sided weakness/numbness; admitted for CVA work-up.  CT head negative for acute change; MRI significant for L thalamic infarct    PT Comments    Patient's daughter/husband present throughout session and very supportive/encouraging to patient.  Very receptive to all education provided by therapist.  Voice understanding of all safety recommendations and voice comfort providing recommended level of assist upon discharge. Patient able to complete gait with variety of assist devices to assess optimal safety/indep with mobility this date--greatest level of patient safety/indep noted with use of RW at this time.  Patient voiced understanding/agreement and awareness of need to utilize RW with all mobility efforts. Will plan to complete BERG balance assessment next date for formal assessment of balance deficits outside of functional mobility.   Follow Up Recommendations  Home health PT;Supervision/Assistance - 24 hour     Equipment Recommendations  Rolling walker with 5" wheels    Recommendations for Other Services       Precautions / Restrictions Precautions Precautions: Fall Restrictions Weight Bearing Restrictions: No    Mobility  Bed Mobility Overal bed mobility: Modified Independent                Transfers Overall transfer level: Needs assistance Equipment used: Rolling walker (2 wheeled) Transfers: Sit to/from Stand Sit to Stand: Min guard         General transfer comment: cuing for hand placement to prevent pulling on RW  Ambulation/Gait Ambulation/Gait assistance: Min guard;Min assist Ambulation Distance (Feet): 220 Feet (x2) Assistive device: Rolling walker (2 wheeled) (RW vs. loftstrand vs. SPC)       General Gait Details: inconsistent R LE step height/length with increased force  of contact at heel strike (likely due to sensory deficit).  Significant difficulty coordinating use of SPC, persistent R Lateral LOB requiring min assist for recovery; better balance/alignment with use of loftstrand, but still with R lateral LOB with dynamic gait components, cga/min assist required; optimal safety/indep with use of RW with patient demonstrating more consistent step heigth/length and foot placement, better balance and overall fluidity.  Occasional cuing for walker position, but much improved from initial trial previous session.  Recommend continued use of RW for all mobility at this time; patient voiced understanding/agreement.   Stairs Stairs: Yes Stairs assistance: Min assist Stair Management: One rail Right Number of Stairs: 6 General stair comments: step to pattern leading with L LE, descending with R LE; no buckling or overt LOB.  Daughter present to observe and voices understanding of guarding/level of assist needed, technique required  Wheelchair Mobility    Modified Rankin (Stroke Patients Only)       Balance Overall balance assessment: Needs assistance Sitting-balance support: No upper extremity supported;Feet supported Sitting balance-Leahy Scale: Good     Standing balance support: No upper extremity supported Standing balance-Leahy Scale: Fair                      Cognition Arousal/Alertness: Awake/alert Behavior During Therapy: Impulsive Overall Cognitive Status: Within Functional Limits for tasks assessed                      Exercises Other Exercises Other Exercises: Educated daughter/husband in level of assist recommended upon discharge (cga with all mobility), use of RW for all mobility; educated on noted deficits and safety considerations  for return home.  Daughter/husband comfortable providing recommended level of care; able to observe patient performance and voice comfort providing necessary level of care.  Very supportive and  encouraging to patient. Other Exercises: Educated on car transfer technique; patient/daughter voiced understanding.    General Comments        Pertinent Vitals/Pain Pain Assessment: No/denies pain    Home Living                      Prior Function            PT Goals (current goals can now be found in the care plan section) Acute Rehab PT Goals Patient Stated Goal: to move around a little bit PT Goal Formulation: With patient Time For Goal Achievement: 08/26/15 Potential to Achieve Goals: Good Progress towards PT goals: Progressing toward goals    Frequency  7X/week    PT Plan Current plan remains appropriate    Co-evaluation             End of Session Equipment Utilized During Treatment: Gait belt Activity Tolerance: Patient tolerated treatment well Patient left: in bed;with call bell/phone within reach;with bed alarm set;with family/visitor present     Time: 8413-2440 PT Time Calculation (min) (ACUTE ONLY): 39 min  Charges:  $Gait Training: 23-37 mins $Therapeutic Activity: 8-22 mins                    G Codes:      Skyra Crichlow H. Owens Shark, PT, DPT, NCS 08/13/2015, 2:05 PM [phone]

## 2015-08-13 NOTE — Plan of Care (Signed)
Problem: Coping: Goal: Ability to verbalize positive feelings about self will improve Outcome: Progressing Pt has very supportive family and husband. Doesn't exhibit any signs of depression.   Problem: Education: Goal: Knowledge of disease or condition will improve Outcome: Progressing Pt had MRI and carotid U/S today.  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   Problem: Health Behavior/Discharge Planning: Goal: Ability to manage health-related needs will improve Outcome: Progressing Pt met w/PT today.  They noted that pt is having coordination/sensation problems w/fine motor skills particularly on R side.  When she tries to open container, her hand shakes.  Pt experiences some R arm/leg drift on NIHSS assessment.  Pt met w/OT today as well. Helped pt to BR - she walked w/walker and was a little unsteady.  She will have Home Health/PT upon d/c.   Problem: Nutrition: Goal: Dietary intake will improve Outcome: Not Progressing Currently pt has poor PO intake - would benefit from dietary supplement.

## 2015-08-14 MED ORDER — ASPIRIN EC 81 MG PO TBEC: 81.0000 mg | DELAYED_RELEASE_TABLET | Freq: Every day | ORAL | Status: AC

## 2015-08-14 MED ORDER — ATORVASTATIN CALCIUM 40 MG PO TABS: 40.0000 mg | ORAL_TABLET | Freq: Every day | ORAL | Status: DC

## 2015-08-14 NOTE — Consult Note (Signed)
Reason for Consult: stroke Referring Physician: Dr. Adela Lank is an 81 y.o. female.  HPI: 81 yo RHD F presents to [facility] secondary to sudden onset R numbness and tingling.  Pt woke up with symptoms so was not a tPA candidate.  Since being here, she noted that the numbness has improved especially in her hand.  No headache noted.  Pt has not had this before.  Past Medical History  Diagnosis Date  . Dementia   . TIA (transient ischemic attack)   . Cancer [facility])     Past Surgical History  Procedure Laterality Date  . Abdominal hysterectomy    . Breast surgery    . Cataract extraction      Family History  Problem Relation Age of Onset  . Cancer Father     Social History:  reports that she has never smoked. She does not have any smokeless tobacco history on file. She reports that she does not drink alcohol. Her drug history is not on file.  Allergies: No Known Allergies  Medications: personally reviewed by me as per chart  Results for orders placed or performed during the hospital encounter of 08/12/15 (from the past 48 hour(s))  Protime-INR     Status: None   Collection Time: 08/12/15 11:16 AM  Result Value Ref Range   Prothrombin Time 12.7 11.4 - 15.0 seconds   INR 0.93   APTT     Status: None   Collection Time: 08/12/15 11:16 AM  Result Value Ref Range   aPTT 29 24 - 36 seconds  CBC     Status: Abnormal   Collection Time: 08/12/15 11:16 AM  Result Value Ref Range   WBC 5.9 3.6 - 11.0 K/uL   RBC 3.96 3.80 - 5.20 MIL/uL   Hemoglobin 13.3 12.0 - 16.0 g/dL   HCT 38.8 35.0 - 47.0 %   MCV 98.0 80.0 - 100.0 fL   MCH 33.5 26.0 - 34.0 pg   MCHC 34.2 32.0 - 36.0 g/dL   RDW 13.8 11.5 - 14.5 %   Platelets 124 (L) 150 - 440 K/uL  Differential     Status: None   Collection Time: 08/12/15 11:16 AM  Result Value Ref Range   Neutrophils Relative % 62 %   Neutro Abs 3.6 1.4 - 6.5 K/uL   Lymphocytes Relative 30 %   Lymphs Abs 1.8 1.0 - 3.6 K/uL   Monocytes Relative 7 %    Monocytes Absolute 0.4 0.2 - 0.9 K/uL   Eosinophils Relative 1 %   Eosinophils Absolute 0.0 0 - 0.7 K/uL   Basophils Relative 0 %   Basophils Absolute 0.0 0 - 0.1 K/uL  Comprehensive metabolic panel     Status: Abnormal   Collection Time: 08/12/15 11:16 AM  Result Value Ref Range   Sodium 141 135 - 145 mmol/L   Potassium 3.5 3.5 - 5.1 mmol/L   Chloride 108 101 - 111 mmol/L   CO2 25 22 - 32 mmol/L   Glucose, Bld 105 (H) 65 - 99 mg/dL   BUN 8 6 - 20 mg/dL   Creatinine, Ser 0.88 0.44 - 1.00 mg/dL   Calcium 10.1 8.9 - 10.3 mg/dL   Total Protein 6.8 6.5 - 8.1 g/dL   Albumin 4.2 3.5 - 5.0 g/dL   AST 23 15 - 41 U/L   ALT 15 14 - 54 U/L   Alkaline Phosphatase 49 38 - 126 U/L   Total Bilirubin 1.0 0.3 - 1.2 mg/dL  GFR calc non Af Amer 59 (L) >60 mL/min   GFR calc Af Amer >60 >60 mL/min    Comment: (NOTE) The eGFR has been calculated using the CKD EPI equation. This calculation has not been validated in all clinical situations. eGFR's persistently <60 mL/min signify possible Chronic Kidney Disease.    Anion gap 8 5 - 15  Lipid panel     Status: Abnormal   Collection Time: 08/12/15  4:15 PM  Result Value Ref Range   Cholesterol 226 (H) 0 - 200 mg/dL   Triglycerides 146 <150 mg/dL   HDL 63 >40 mg/dL   Total CHOL/HDL Ratio 3.6 RATIO   VLDL 29 0 - 40 mg/dL   LDL Cholesterol 134 (H) 0 - 99 mg/dL    Comment:        Total Cholesterol/HDL:CHD Risk Coronary Heart Disease Risk Table                     Men   Women  1/2 Average Risk   3.4   3.3  Average Risk       5.0   4.4  2 X Average Risk   9.6   7.1  3 X Average Risk  23.4   11.0        Use the calculated Patient Ratio above and the CHD Risk Table to determine the patient's CHD Risk.        ATP III CLASSIFICATION (LDL):  <100     mg/dL   Optimal  100-129  mg/dL   Near or Above                    Optimal  130-159  mg/dL   Borderline  160-189  mg/dL   High  >190     mg/dL   Very High   Basic metabolic panel     Status:  Abnormal   Collection Time: 08/13/15  4:57 AM  Result Value Ref Range   Sodium 142 135 - 145 mmol/L   Potassium 3.8 3.5 - 5.1 mmol/L   Chloride 115 (H) 101 - 111 mmol/L   CO2 24 22 - 32 mmol/L   Glucose, Bld 88 65 - 99 mg/dL   BUN 9 6 - 20 mg/dL   Creatinine, Ser 0.82 0.44 - 1.00 mg/dL   Calcium 9.9 8.9 - 10.3 mg/dL   GFR calc non Af Amer >60 >60 mL/min   GFR calc Af Amer >60 >60 mL/min    Comment: (NOTE) The eGFR has been calculated using the CKD EPI equation. This calculation has not been validated in all clinical situations. eGFR's persistently <60 mL/min signify possible Chronic Kidney Disease.    Anion gap 3 (L) 5 - 15  CBC     Status: Abnormal   Collection Time: 08/13/15  4:57 AM  Result Value Ref Range   WBC 6.1 3.6 - 11.0 K/uL   RBC 3.77 (L) 3.80 - 5.20 MIL/uL   Hemoglobin 12.7 12.0 - 16.0 g/dL   HCT 36.9 35.0 - 47.0 %   MCV 97.7 80.0 - 100.0 fL   MCH 33.7 26.0 - 34.0 pg   MCHC 34.5 32.0 - 36.0 g/dL   RDW 14.0 11.5 - 14.5 %   Platelets 128 (L) 150 - 440 K/uL    Ct Angio Head W/cm &/or Wo Cm  08/13/2015  ADDENDUM REPORT: 08/13/2015 14:43 ADDENDUM: There is a discrepancy in the body the report regarding the location of the left ICA aneurysm.  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 Electronically Signed   By: San Morelle M.D.   On: 08/13/2015 14:43  08/13/2015  CLINICAL DATA:  Acute infarct in the left thalamus. EXAM: CT ANGIOGRAPHY HEAD AND NECK TECHNIQUE: Multidetector CT imaging of the head and neck was performed using the standard protocol during bolus administration of intravenous contrast. Multiplanar CT image reconstructions and MIPs were obtained to evaluate the vascular anatomy. Carotid stenosis measurements (when applicable) are obtained utilizing NASCET criteria, using the distal internal carotid diameter as the denominator. CONTRAST:  63m OMNIPAQUE IOHEXOL 350 MG/ML SOLN COMPARISON:  None. FINDINGS: CT HEAD Brain: The acute left  thalamic infarct is again noted. No other acute infarcts are evident. Extensive periventricular white matter disease is otherwise stable. Remote infarcts in the inferior left cerebellum are again noted. Calvarium and skull base: The skullbase is within normal limits. Paranasal sinuses and mastoid air cells are clear. Calvarium is intact. Paranasal sinuses: The paranasal sinuses and mastoid air cells are clear. Orbits: Bilateral lens replacements are noted. The globes and orbits are otherwise intact. CTA NECK Aortic arch: The left vertebral artery originates from the aortic arch. Dense atherosclerotic calcifications are present without significant stenosis of the great vessels. Right carotid system: The right common carotid artery is within normal limits. Minimal atherosclerotic changes are noted in the carotid bifurcation and proximal right ICA without significant stenosis. There is moderate tortuosity of the distal right ICA without a significant stenosis. Left carotid system: The left common carotid artery is within normal limits. Atherosclerotic calcifications are present at the bifurcation and into the proximal right ICA. There is mild tortuosity of the mid ICA with a 50% stenosis. More distally the right internal carotid artery is a wide necked 2.1 x 1.6 x 2.0 cm aneurysm. The neck a this aneurysm measures over 1.0 cm. The more distal ICA is within normal limits. Vertebral arteries:The right vertebral artery is the dominant vessel. There is no significant stenosis or focal vascular injury in either vertebral artery. There is focal tortuosity on the left at the C5 level. The vertebrobasilar junction is within normal limits. Both PICA origins are visualized and normal. Skeleton: Multilevel degenerative changes are present throughout the cervical spine with left greater than right facet degenerative change at C3-4, C4-5, and C5-6. Endplate changes are noted from C3-4 through C6-7. Osseous foraminal narrowing is  worse on the left. No focal lytic or blastic lesions are present. The patient is edentulous. Other neck: No focal mucosal or submucosal lesions are present. There is no significant adenopathy. The upper mediastinum is within normal limits. The thyroid is normal. The lung apices are clear. CTA HEAD Anterior circulation: The internal carotid arteries demonstrate mild atherosclerotic calcifications in the cavernous segments. There is no significant stenosis through the ICA termini. The A1 and M1 segments are normal. The right A1 segment is dominant. The MCA bifurcations are intact. The ACA and MCA branch vessels are within normal limits. Posterior circulation: The right vertebral artery is the dominant vessel. The basilar artery is within normal limits. Both posterior cerebral arteries originate from the basilar tip. The PCA branch vessels are within normal limits bilaterally. Venous sinuses: The dural sinuses are patent. The right transverse sinus is dominant. The left sigmoid sinus and tapers and drains via collaterals versus is a significant internal jugular vein. The right internal jugular vein is mildly enlarged. Anatomic variants: None Delayed phase: No pathologic enhancement is present. IMPRESSION: 1. Acute nonhemorrhagic infarct of  the left thalamus. 2. 2.1 x 1.6 x 2.0 cm distal left ICA aneurysm or pseudoaneurysm with a wide neck measuring up to 1.0 cm. 3. Atherosclerotic changes of both carotid bifurcations without significant stenosis. 4. 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 5. Extensive atherosclerotic changes at the arch without significant stenosis. 6. The left internal jugular vein is occluded an the left sigmoid sinus drains via collaterals. 7. Multilevel degenerative changes in the cervical spine. Electronically Signed: By: San Morelle M.D. On: 08/13/2015 13:57   Ct Head Wo Contrast  08/12/2015  CLINICAL DATA:  Fall. Right leg weakness and numbness. Right arm  numbness. EXAM: CT HEAD WITHOUT CONTRAST TECHNIQUE: Contiguous axial images were obtained from the base of the skull through the vertex without intravenous contrast. COMPARISON:  03/10/2012 FINDINGS: There is atrophy and chronic small vessel disease changes. No acute intracranial abnormality. Specifically, no hemorrhage, hydrocephalus, mass lesion, acute infarction, or significant intracranial injury. No acute calvarial abnormality. Visualized paranasal sinuses and mastoids clear. Orbital soft tissues unremarkable. IMPRESSION: No acute intracranial abnormality. Atrophy, chronic microvascular disease. Electronically Signed   By: Rolm Baptise M.D.   On: 08/12/2015 12:10   Ct Angio Neck W/cm &/or Wo/cm  08/13/2015  ADDENDUM REPORT: 08/13/2015 14:43 ADDENDUM: There is a discrepancy in the body the report regarding the location of the left ICA aneurysm. 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 Electronically Signed   By: San Morelle M.D.   On: 08/13/2015 14:43  08/13/2015  CLINICAL DATA:  Acute infarct in the left thalamus. EXAM: CT ANGIOGRAPHY HEAD AND NECK TECHNIQUE: Multidetector CT imaging of the head and neck was performed using the standard protocol during bolus administration of intravenous contrast. Multiplanar CT image reconstructions and MIPs were obtained to evaluate the vascular anatomy. Carotid stenosis measurements (when applicable) are obtained utilizing NASCET criteria, using the distal internal carotid diameter as the denominator. CONTRAST:  75m OMNIPAQUE IOHEXOL 350 MG/ML SOLN COMPARISON:  None. FINDINGS: CT HEAD Brain: The acute left thalamic infarct is again noted. No other acute infarcts are evident. Extensive periventricular white matter disease is otherwise stable. Remote infarcts in the inferior left cerebellum are again noted. Calvarium and skull base: The skullbase is within normal limits. Paranasal sinuses and mastoid air cells are clear. Calvarium is  intact. Paranasal sinuses: The paranasal sinuses and mastoid air cells are clear. Orbits: Bilateral lens replacements are noted. The globes and orbits are otherwise intact. CTA NECK Aortic arch: The left vertebral artery originates from the aortic arch. Dense atherosclerotic calcifications are present without significant stenosis of the great vessels. Right carotid system: The right common carotid artery is within normal limits. Minimal atherosclerotic changes are noted in the carotid bifurcation and proximal right ICA without significant stenosis. There is moderate tortuosity of the distal right ICA without a significant stenosis. Left carotid system: The left common carotid artery is within normal limits. Atherosclerotic calcifications are present at the bifurcation and into the proximal right ICA. There is mild tortuosity of the mid ICA with a 50% stenosis. More distally the right internal carotid artery is a wide necked 2.1 x 1.6 x 2.0 cm aneurysm. The neck a this aneurysm measures over 1.0 cm. The more distal ICA is within normal limits. Vertebral arteries:The right vertebral artery is the dominant vessel. There is no significant stenosis or focal vascular injury in either vertebral artery. There is focal tortuosity on the left at the C5 level. The vertebrobasilar junction is within normal  limits. Both PICA origins are visualized and normal. Skeleton: Multilevel degenerative changes are present throughout the cervical spine with left greater than right facet degenerative change at C3-4, C4-5, and C5-6. Endplate changes are noted from C3-4 through C6-7. Osseous foraminal narrowing is worse on the left. No focal lytic or blastic lesions are present. The patient is edentulous. Other neck: No focal mucosal or submucosal lesions are present. There is no significant adenopathy. The upper mediastinum is within normal limits. The thyroid is normal. The lung apices are clear. CTA HEAD Anterior circulation: The internal  carotid arteries demonstrate mild atherosclerotic calcifications in the cavernous segments. There is no significant stenosis through the ICA termini. The A1 and M1 segments are normal. The right A1 segment is dominant. The MCA bifurcations are intact. The ACA and MCA branch vessels are within normal limits. Posterior circulation: The right vertebral artery is the dominant vessel. The basilar artery is within normal limits. Both posterior cerebral arteries originate from the basilar tip. The PCA branch vessels are within normal limits bilaterally. Venous sinuses: The dural sinuses are patent. The right transverse sinus is dominant. The left sigmoid sinus and tapers and drains via collaterals versus is a significant internal jugular vein. The right internal jugular vein is mildly enlarged. Anatomic variants: None Delayed phase: No pathologic enhancement is present. IMPRESSION: 1. Acute nonhemorrhagic infarct of the left thalamus. 2. 2.1 x 1.6 x 2.0 cm distal left ICA aneurysm or pseudoaneurysm with a wide neck measuring up to 1.0 cm. 3. Atherosclerotic changes of both carotid bifurcations without significant stenosis. 4. 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 5. Extensive atherosclerotic changes at the arch without significant stenosis. 6. The left internal jugular vein is occluded an the left sigmoid sinus drains via collaterals. 7. Multilevel degenerative changes in the cervical spine. Electronically Signed: By: San Morelle M.D. On: 08/13/2015 13:57   Mr Brain Wo Contrast  08/13/2015  CLINICAL DATA:  TIA. Dementia. Right-sided weakness since yesterday morning. EXAM: MRI HEAD WITHOUT CONTRAST TECHNIQUE: Multiplanar, multiecho pulse sequences of the brain and surrounding structures were obtained without intravenous contrast. COMPARISON:  CT head 08/12/2015, 03/10/2012, MRI 07/16/2008 FINDINGS: Acute infarct left thalamus measuring 12 mm. No other areas of acute infarction. Chronic  microvascular ischemic change in the white matter and pons bilaterally. Chronic infarcts in the cerebellum bilaterally. Moderate atrophy.  Ventricular enlargement consistent with atrophy. Possible chronic hemorrhage in the left inferior cerebellum. No fluid collection. No other areas of hemorrhage. Well-circumscribed mass lesion in the left parapharyngeal space measures 14 x 20 mm. This has low signal on T1 and T2 and is suggestive of flow void from an aneurysm. Small fleck of calcium in the wall of this lesion is seen on prior CT. This was present on the MRI of 2009. Other lesions that can occur in this space include salivary tumor and nerve sheath tumor. Asymmetric low signal below the skullbase on the right may be tortuous right jugular vein which is relatively large in dominant. Small left jugular vein noted. IMPRESSION: Acute infarct left thalamus. Atrophy and chronic ischemia 14 x 20 mm mass left parapharyngeal space. 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 Neoplasm such as nerve sheath tumor and salivary gland tumor considered less likely. This appears to be a chronic finding and was incompletely imaged in 2009. Consider follow-up CTA of the head and neck for further evaluation. Electronically Signed   By: Franchot Gallo M.D.   On: 08/13/2015 10:32   US  Carotid Bilateral  08/13/2015  CLINICAL DATA:  CVA. EXAM: BILATERAL CAROTID DUPLEX ULTRASOUND TECHNIQUE: Pearline Cables scale imaging, color Doppler and duplex ultrasound were performed of bilateral carotid and vertebral arteries in the neck. COMPARISON:  None. FINDINGS: Criteria: Quantification of carotid stenosis is based on velocity parameters that correlate the residual internal carotid diameter with NASCET-based stenosis levels, using the diameter of the distal internal carotid lumen as the denominator for stenosis measurement. The following velocity measurements were obtained: RIGHT ICA:  59/15 cm/sec CCA:  64/40 cm/sec SYSTOLIC ICA/CCA  RATIO:  1.0 DIASTOLIC ICA/CCA RATIO:  1.0 ECA:  71 cm/sec LEFT ICA:  72/16 cm/sec CCA:  34/74 cm/sec SYSTOLIC ICA/CCA RATIO:  1.0 DIASTOLIC ICA/CCA RATIO:  1.6 ECA:  48 cm/sec RIGHT CAROTID ARTERY: There is a moderate amount of eccentric mixed echogenic plaque within distal aspect the right common carotid artery (image 10). There are no elevated peak systolic velocities within the interrogated course of the right internal carotid artery to suggest a hemodynamically significant stenosis. RIGHT VERTEBRAL ARTERY:  Antegrade flow LEFT CAROTID ARTERY: There is a minimal amount of eccentric mixed echogenic plaque within the mid and distal aspects of the left common carotid artery (images 40, 41, 44 and 45). There is a moderate to large amount of eccentric mixed echogenic plaque within the left carotid bulb (images 49 and 50), not resulting in elevated peak systolic velocities within the interrogated course of the left internal carotid artery to suggest a hemodynamically significant stenosis. LEFT VERTEBRAL ARTERY:  Antegrade flow IMPRESSION: Moderate to large amount of bilateral atherosclerotic plaque, left greater than right, not resulting in hemodynamically significant stenosis within either internal carotid artery. Electronically Signed   By: Sandi Mariscal M.D.   On: 08/13/2015 13:04    Review of Systems  Constitutional: Negative.   HENT: Negative.   Eyes: Negative.   Respiratory: Negative.   Cardiovascular: Negative.   Gastrointestinal: Negative.   Genitourinary: Negative.   Musculoskeletal: Negative.   Skin: Negative.   Neurological: Positive for tingling and sensory change. Negative for dizziness, tremors, speech change, focal weakness, seizures and loss of consciousness.  Psychiatric/Behavioral: Negative.    Blood pressure 148/95, pulse 88, temperature 98.2 F (36.8 C), temperature source Oral, resp. rate 18, height 5' (1.524 m), weight 46.267 kg (102 lb), SpO2 96 %. Physical Exam  Nursing note and  vitals reviewed. Constitutional: She is oriented to person, place, and time. She appears well-developed and well-nourished.  HENT:  Head: Normocephalic and atraumatic.  Right Ear: External ear normal.  Left Ear: External ear normal.  Nose: Nose normal.  Mouth/Throat: Oropharynx is clear and moist.  Eyes: Conjunctivae and EOM are normal. Pupils are equal, round, and reactive to light.  Neck: Normal range of motion. Neck supple.  Cardiovascular: Normal rate, regular rhythm, normal heart sounds and intact distal pulses.   Respiratory: Effort normal and breath sounds normal.  GI: Soft. Bowel sounds are normal.  Musculoskeletal: Normal range of motion. She exhibits no edema.  Neurological: She is alert and oriented to person, place, and time. She has normal reflexes. She displays normal reflexes. No cranial nerve deficit. She exhibits normal muscle tone. Coordination normal.  Skin: Skin is warm and dry. She is not diaphoretic.  Psychiatric: She has a normal mood and affect.   MRI of brain personally reviewed by me and shows small L thalamic infarct, mild white matter changes  Assessment/Plan: 1.  L thalamic lacunar infarct-  Etiology is likely HTN related and mildly symptomatic 2.  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

## 2015-08-14 NOTE — Progress Notes (Signed)
Physical Therapy Treatment Patient Details Name: Savannah Lee MRN: [id number] DOB: [birth year] Today's Date: 08/14/2015    History of Present Illness presented to ER with onset of R sided weakness/numbness; admitted for CVA work-up.  CT head negative for acute change; MRI significant for L thalamic infarct.  CTA suggestive of L ICA aneurysm (recommended for outpatient follow up)    PT Comments    Patient continues to progress with mobility, cga/close sup with all functional mobility using RW.  Continues to require cuing for safety; remains slightly impulsive. BERG balance assessment, 35/56, indicative of high fall risk.  Decreased strength/stability noted R posterior/lateral hip during periods of modified BOS/SLS; difficulty maintaining positions with eyes closed secondary to persistent sensory deficits in R foot.   Follow Up Recommendations  Home health PT;Supervision/Assistance - 24 hour     Equipment Recommendations  Rolling walker with 5" wheels    Recommendations for Other Services       Precautions / Restrictions Precautions Precautions: Fall Restrictions Weight Bearing Restrictions: No    Mobility  Bed Mobility Overal bed mobility: Modified Independent                Transfers Overall transfer level: Needs assistance Equipment used: Rolling walker (2 wheeled) Transfers: Sit to/from Stand Sit to Stand: Supervision         General transfer comment: cuing for hand placement to prevent pulling on RW; limited carry-over of education  Ambulation/Gait Ambulation/Gait assistance: Min guard;Supervision Ambulation Distance (Feet): 220 Feet     Gait velocity: 10' walk time, 6 seconds   General Gait Details: improving consistently and symmetry of bilat LE foot placement; mild medial heel whip R LE in toe off.  Improved cadence and gait speed without overt LOB this date.  Able to complete head turns, changes of direction and distractionary conversation without  LOB.   Stairs            Wheelchair Mobility    Modified Rankin (Stroke Patients Only)       Balance Overall balance assessment: Needs assistance Sitting-balance support: No upper extremity supported;Feet supported Sitting balance-Leahy Scale: Good     Standing balance support: No upper extremity supported Standing balance-Leahy Scale: Fair                      Cognition Arousal/Alertness: Awake/alert Behavior During Therapy: WFL for tasks assessed/performed;Impulsive Overall Cognitive Status: Within Functional Limits for tasks assessed                      Exercises Other Exercises Other Exercises: BERG balance assessment, 35/56, indicative of high fall risk.  Decreased strength/stability noted R posterior/lateral hip during periods of modified BOS/SLS; difficulty maintaining positions with eyes closed secondary to persistent sensory deficits in R foot. Other Exercises: Fitted RW for home use--one delivered slightly too tall; requested per care management standard RW be swapped out for youth RW prior to discharge.  Care management voiced agreement/understanding.    General Comments        Pertinent Vitals/Pain      Home Living                      Prior Function            PT Goals (current goals can now be found in the care plan section) Acute Rehab PT Goals Patient Stated Goal: to move around a little bit PT Goal Formulation: With patient Time For  Goal Achievement: 08/26/15 Potential to Achieve Goals: Good Progress towards PT goals: Progressing toward goals    Frequency  7X/week    PT Plan Current plan remains appropriate    Co-evaluation             End of Session Equipment Utilized During Treatment: Gait belt Activity Tolerance: Patient tolerated treatment well Patient left: with call bell/phone within reach;in chair;with chair alarm set     Time: 1855-0158 PT Time Calculation (min) (ACUTE ONLY): 26  min  Charges:  $Gait Training: 8-22 mins $Neuromuscular Re-education: 8-22 mins                    G Codes:       Corah Willeford H. Owens Shark, PT, DPT, NCS 08/14/2015, 9:39 AM [phone]

## 2015-08-14 NOTE — Care Management (Signed)
Discharge to home today per Dr. Leslye Peer. Will be followed by [facility] for services. Youth rolling walker obtained per [facility]. Family will transport. Shelbie Ammons RN MSN CCM Care Management [phone]

## 2015-08-14 NOTE — Progress Notes (Signed)
MD order received in [facility] to discharge pt home with home health today; Care Management previously established home health services with [facility] for RN and PT services; verbally reviewed AVS with pt and pt's daughter including medications/gave Rxs to pt; follow up appointment with Dr Hall Busing on 08/25/15; no questions voiced at this time; pt discharged with new rolling walker by volunteer to the visitor's entrance

## 2015-08-14 NOTE — Care Management Important Message (Signed)
Important Message  Patient Details  Name: Savannah Lee MRN: [id number] Date of Birth: [birth year]   Medicare Important Message Given:  Yes-second notification given    Shelbie Ammons, RN 08/14/2015, 9:00 AM

## 2015-08-14 NOTE — Discharge Summary (Signed)
[facility] NAME: Savannah Lee    MR#:  [id number]  DATE OF BIRTH:  1929/03/08  DATE OF ADMISSION:  08/12/2015 ADMITTING PHYSICIAN: Vaughan Basta, MD  DATE OF DISCHARGE: 08/14/2015  PRIMARY CARE PHYSICIAN: No primary care provider on file.    ADMISSION DIAGNOSIS:  CVA (cerebral infarction) [I63.9] Acute CVA (cerebrovascular accident) ([place]) [I63.9]  DISCHARGE DIAGNOSIS:  Principal Problem:   CVA (cerebral infarction)   SECONDARY DIAGNOSIS:   Past Medical History  Diagnosis Date  . Dementia   . TIA (transient ischemic attack)   . Cancer [facility])     HOSPITAL COURSE:   1. Acute left thalamic stroke. Patient presented with right sided weakness. Symptoms have improved. Physical therapy recommended home with home health. Aspirin and statin started. Likely the stroke is related to hypertension at home. 2. Distal left ICA aneurysm. In speaking with neurology, unlikely the cause of the stroke. Can follow-up with neurosurgery had a tertiary care center as outpatient. 3. Essential hypertension- allow permissive hypertension with stroke. Likely will need medication for blood pressure as outpatient. 4. Early dementia on Aricept 5. Chronic pain syndrome- on pain medication and anxiety medication  DISCHARGE CONDITIONS:   Satisfactory  CONSULTS OBTAINED:  Treatment Team:  Valora Corporal, MD  DRUG ALLERGIES:  No Known Allergies  DISCHARGE MEDICATIONS:   Current Discharge Medication List    START taking these medications   Details  aspirin EC 81 MG tablet Take 1 tablet (81 mg total) by mouth daily. Qty: 30 tablet, Refills: 0    atorvastatin (LIPITOR) 40 MG tablet Take 1 tablet (40 mg total) by mouth daily at 6 PM. Qty: 30 tablet, Refills: 0      CONTINUE these medications which have NOT CHANGED   Details  calcium citrate-vitamin D (CITRACAL+D) 315-200 MG-UNIT tablet Take 1 tablet by mouth daily.    diazepam  (VALIUM) 5 MG tablet Take 5 mg by mouth daily as needed for muscle spasms.     diphenhydrAMINE (BENADRYL) 25 MG tablet Take 25 mg by mouth at bedtime as needed for allergies.     donepezil (ARICEPT) 5 MG tablet Take 5 mg by mouth every evening.    DULoxetine (CYMBALTA) 60 MG capsule Take 60 mg by mouth daily.    HYDROcodone-acetaminophen (NORCO) 7.5-325 MG tablet Take 0.5-1 tablets by mouth 3 (three) times daily as needed for moderate pain or severe pain.     Multiple Vitamins-Minerals (CENTRUM SILVER PO) Take 1 tablet by mouth daily.      STOP taking these medications     tizanidine (ZANAFLEX) 2 MG capsule          DISCHARGE INSTRUCTIONS:   Follow-up with PMD one week Can follow-up with neurosurgery at a tertiary care center as outpatient   If you experience worsening of your admission symptoms, develop shortness of breath, life threatening emergency, suicidal or homicidal thoughts you must seek medical attention immediately by calling 911 or calling your MD immediately  if symptoms less severe.  You Must read complete instructions/literature along with all the possible adverse reactions/side effects for all the Medicines you take and that have been prescribed to you. Take any new Medicines after you have completely understood and accept all the possible adverse reactions/side effects.   Please note  You were cared for by a hospitalist during your hospital stay. If you have any questions about your discharge medications or the care you received while you were in the hospital  after you are discharged, you can call the unit and asked to speak with the hospitalist on call if the hospitalist that took care of you is not available. Once you are discharged, your primary care physician will handle any further medical issues. Please note that NO REFILLS for any discharge medications will be authorized once you are discharged, as it is imperative that you return to your primary care  physician (or establish a relationship with a primary care physician if you do not have one) for your aftercare needs so that they can reassess your need for medications and monitor your lab values.    Today   CHIEF COMPLAINT:   Chief Complaint  Patient presents with  . Numbness    HISTORY OF PRESENT ILLNESS:  Savannah Lee  is a 81 y.o. female presented with right-sided weakness. Found to have an acute stroke.   VITAL SIGNS:  Blood pressure 148/95, pulse 88, temperature 98.2 F (36.8 C), temperature source Oral, resp. rate 18, height 5' (1.524 m), weight 46.267 kg (102 lb), SpO2 96 %.   PHYSICAL EXAMINATION:  GENERAL:  81 y.o.-year-old patient lying in the bed with no acute distress.  EYES: Pupils equal, round, reactive to light and accommodation. No scleral icterus. Extraocular muscles intact.  HEENT: Head atraumatic, normocephalic. Oropharynx and nasopharynx clear.  NECK:  Supple, no jugular venous distention. No thyroid enlargement, no tenderness.  LUNGS: Normal breath sounds bilaterally, no wheezing, rales,rhonchi or crepitation. No use of accessory muscles of respiration.  CARDIOVASCULAR: S1, S2 normal. No murmurs, rubs, or gallops.  ABDOMEN: Soft, non-tender, non-distended. Bowel sounds present. No organomegaly or mass.  EXTREMITIES: No pedal edema, cyanosis, or clubbing.  NEUROLOGIC: Cranial nerves II through XII are intact. Muscle strength 5/5 in all extremities. Sensation intact. Gait - patient walked quickly with physical therapy with a walker. PSYCHIATRIC: The patient is alert and oriented x 3.  SKIN: No obvious rash, lesion, or ulcer.   DATA REVIEW:   CBC  Recent Labs Lab 08/13/15 0457  WBC 6.1  HGB 12.7  HCT 36.9  PLT 128*    Chemistries   Recent Labs Lab 08/12/15 1116 08/13/15 0457  NA 141 142  K 3.5 3.8  CL 108 115*  CO2 25 24  GLUCOSE 105* 88  BUN 8 9  CREATININE 0.88 0.82  CALCIUM 10.1 9.9  AST 23  --   ALT 15  --   ALKPHOS 49  --    BILITOT 1.0  --     RADIOLOGY:  Ct Angio Head W/cm &/or Wo Cm  08/13/2015  ADDENDUM REPORT: 08/13/2015 14:43 ADDENDUM: There is a discrepancy in the body the report regarding the location of the left ICA aneurysm. 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 Electronically Signed   By: San Morelle M.D.   On: 08/13/2015 14:43  08/13/2015  CLINICAL DATA:  Acute infarct in the left thalamus. EXAM: CT ANGIOGRAPHY HEAD AND NECK TECHNIQUE: Multidetector CT imaging of the head and neck was performed using the standard protocol during bolus administration of intravenous contrast. Multiplanar CT image reconstructions and MIPs were obtained to evaluate the vascular anatomy. Carotid stenosis measurements (when applicable) are obtained utilizing NASCET criteria, using the distal internal carotid diameter as the denominator. CONTRAST:  57mL OMNIPAQUE IOHEXOL 350 MG/ML SOLN COMPARISON:  None. FINDINGS: CT HEAD Brain: The acute left thalamic infarct is again noted. No other acute infarcts are evident. Extensive periventricular white matter disease is otherwise stable. Remote infarcts  in the inferior left cerebellum are again noted. Calvarium and skull base: The skullbase is within normal limits. Paranasal sinuses and mastoid air cells are clear. Calvarium is intact. Paranasal sinuses: The paranasal sinuses and mastoid air cells are clear. Orbits: Bilateral lens replacements are noted. The globes and orbits are otherwise intact. CTA NECK Aortic arch: The left vertebral artery originates from the aortic arch. Dense atherosclerotic calcifications are present without significant stenosis of the great vessels. Right carotid system: The right common carotid artery is within normal limits. Minimal atherosclerotic changes are noted in the carotid bifurcation and proximal right ICA without significant stenosis. There is moderate tortuosity of the distal right ICA without a significant stenosis. Left  carotid system: The left common carotid artery is within normal limits. Atherosclerotic calcifications are present at the bifurcation and into the proximal right ICA. There is mild tortuosity of the mid ICA with a 50% stenosis. More distally the right internal carotid artery is a wide necked 2.1 x 1.6 x 2.0 cm aneurysm. The neck a this aneurysm measures over 1.0 cm. The more distal ICA is within normal limits. Vertebral arteries:The right vertebral artery is the dominant vessel. There is no significant stenosis or focal vascular injury in either vertebral artery. There is focal tortuosity on the left at the C5 level. The vertebrobasilar junction is within normal limits. Both PICA origins are visualized and normal. Skeleton: Multilevel degenerative changes are present throughout the cervical spine with left greater than right facet degenerative change at C3-4, C4-5, and C5-6. Endplate changes are noted from C3-4 through C6-7. Osseous foraminal narrowing is worse on the left. No focal lytic or blastic lesions are present. The patient is edentulous. Other neck: No focal mucosal or submucosal lesions are present. There is no significant adenopathy. The upper mediastinum is within normal limits. The thyroid is normal. The lung apices are clear. CTA HEAD Anterior circulation: The internal carotid arteries demonstrate mild atherosclerotic calcifications in the cavernous segments. There is no significant stenosis through the ICA termini. The A1 and M1 segments are normal. The right A1 segment is dominant. The MCA bifurcations are intact. The ACA and MCA branch vessels are within normal limits. Posterior circulation: The right vertebral artery is the dominant vessel. The basilar artery is within normal limits. Both posterior cerebral arteries originate from the basilar tip. The PCA branch vessels are within normal limits bilaterally. Venous sinuses: The dural sinuses are patent. The right transverse sinus is dominant. The  left sigmoid sinus and tapers and drains via collaterals versus is a significant internal jugular vein. The right internal jugular vein is mildly enlarged. Anatomic variants: None Delayed phase: No pathologic enhancement is present. IMPRESSION: 1. Acute nonhemorrhagic infarct of the left thalamus. 2. 2.1 x 1.6 x 2.0 cm distal left ICA aneurysm or pseudoaneurysm with a wide neck measuring up to 1.0 cm. 3. Atherosclerotic changes of both carotid bifurcations without significant stenosis. 4. 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 5. Extensive atherosclerotic changes at the arch without significant stenosis. 6. The left internal jugular vein is occluded an the left sigmoid sinus drains via collaterals. 7. Multilevel degenerative changes in the cervical spine. Electronically Signed: By: San Morelle M.D. On: 08/13/2015 13:57   Ct Head Wo Contrast  08/12/2015  CLINICAL DATA:  Fall. Right leg weakness and numbness. Right arm numbness. EXAM: CT HEAD WITHOUT CONTRAST TECHNIQUE: Contiguous axial images were obtained from the base of the skull through the vertex without intravenous contrast. COMPARISON:  03/10/2012  FINDINGS: There is atrophy and chronic small vessel disease changes. No acute intracranial abnormality. Specifically, no hemorrhage, hydrocephalus, mass lesion, acute infarction, or significant intracranial injury. No acute calvarial abnormality. Visualized paranasal sinuses and mastoids clear. Orbital soft tissues unremarkable. IMPRESSION: No acute intracranial abnormality. Atrophy, chronic microvascular disease. Electronically Signed   By: Rolm Baptise M.D.   On: 08/12/2015 12:10   Ct Angio Neck W/cm &/or Wo/cm  08/13/2015  ADDENDUM REPORT: 08/13/2015 14:43 ADDENDUM: There is a discrepancy in the body the report regarding the location of the left ICA aneurysm. 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 Electronically Signed   By: San Morelle  M.D.   On: 08/13/2015 14:43  08/13/2015  CLINICAL DATA:  Acute infarct in the left thalamus. EXAM: CT ANGIOGRAPHY HEAD AND NECK TECHNIQUE: Multidetector CT imaging of the head and neck was performed using the standard protocol during bolus administration of intravenous contrast. Multiplanar CT image reconstructions and MIPs were obtained to evaluate the vascular anatomy. Carotid stenosis measurements (when applicable) are obtained utilizing NASCET criteria, using the distal internal carotid diameter as the denominator. CONTRAST:  29mL OMNIPAQUE IOHEXOL 350 MG/ML SOLN COMPARISON:  None. FINDINGS: CT HEAD Brain: The acute left thalamic infarct is again noted. No other acute infarcts are evident. Extensive periventricular white matter disease is otherwise stable. Remote infarcts in the inferior left cerebellum are again noted. Calvarium and skull base: The skullbase is within normal limits. Paranasal sinuses and mastoid air cells are clear. Calvarium is intact. Paranasal sinuses: The paranasal sinuses and mastoid air cells are clear. Orbits: Bilateral lens replacements are noted. The globes and orbits are otherwise intact. CTA NECK Aortic arch: The left vertebral artery originates from the aortic arch. Dense atherosclerotic calcifications are present without significant stenosis of the great vessels. Right carotid system: The right common carotid artery is within normal limits. Minimal atherosclerotic changes are noted in the carotid bifurcation and proximal right ICA without significant stenosis. There is moderate tortuosity of the distal right ICA without a significant stenosis. Left carotid system: The left common carotid artery is within normal limits. Atherosclerotic calcifications are present at the bifurcation and into the proximal right ICA. There is mild tortuosity of the mid ICA with a 50% stenosis. More distally the right internal carotid artery is a wide necked 2.1 x 1.6 x 2.0 cm aneurysm. The neck a this  aneurysm measures over 1.0 cm. The more distal ICA is within normal limits. Vertebral arteries:The right vertebral artery is the dominant vessel. There is no significant stenosis or focal vascular injury in either vertebral artery. There is focal tortuosity on the left at the C5 level. The vertebrobasilar junction is within normal limits. Both PICA origins are visualized and normal. Skeleton: Multilevel degenerative changes are present throughout the cervical spine with left greater than right facet degenerative change at C3-4, C4-5, and C5-6. Endplate changes are noted from C3-4 through C6-7. Osseous foraminal narrowing is worse on the left. No focal lytic or blastic lesions are present. The patient is edentulous. Other neck: No focal mucosal or submucosal lesions are present. There is no significant adenopathy. The upper mediastinum is within normal limits. The thyroid is normal. The lung apices are clear. CTA HEAD Anterior circulation: The internal carotid arteries demonstrate mild atherosclerotic calcifications in the cavernous segments. There is no significant stenosis through the ICA termini. The A1 and M1 segments are normal. The right A1 segment is dominant. The MCA bifurcations are intact. The ACA and  MCA branch vessels are within normal limits. Posterior circulation: The right vertebral artery is the dominant vessel. The basilar artery is within normal limits. Both posterior cerebral arteries originate from the basilar tip. The PCA branch vessels are within normal limits bilaterally. Venous sinuses: The dural sinuses are patent. The right transverse sinus is dominant. The left sigmoid sinus and tapers and drains via collaterals versus is a significant internal jugular vein. The right internal jugular vein is mildly enlarged. Anatomic variants: None Delayed phase: No pathologic enhancement is present. IMPRESSION: 1. Acute nonhemorrhagic infarct of the left thalamus. 2. 2.1 x 1.6 x 2.0 cm distal left ICA  aneurysm or pseudoaneurysm with a wide neck measuring up to 1.0 cm. 3. Atherosclerotic changes of both carotid bifurcations without significant stenosis. 4. 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 5. Extensive atherosclerotic changes at the arch without significant stenosis. 6. The left internal jugular vein is occluded an the left sigmoid sinus drains via collaterals. 7. Multilevel degenerative changes in the cervical spine. Electronically Signed: By: San Morelle M.D. On: 08/13/2015 13:57   Mr Brain Wo Contrast  08/13/2015  CLINICAL DATA:  TIA. Dementia. Right-sided weakness since yesterday morning. EXAM: MRI HEAD WITHOUT CONTRAST TECHNIQUE: Multiplanar, multiecho pulse sequences of the brain and surrounding structures were obtained without intravenous contrast. COMPARISON:  CT head 08/12/2015, 03/10/2012, MRI 07/16/2008 FINDINGS: Acute infarct left thalamus measuring 12 mm. No other areas of acute infarction. Chronic microvascular ischemic change in the white matter and pons bilaterally. Chronic infarcts in the cerebellum bilaterally. Moderate atrophy.  Ventricular enlargement consistent with atrophy. Possible chronic hemorrhage in the left inferior cerebellum. No fluid collection. No other areas of hemorrhage. Well-circumscribed mass lesion in the left parapharyngeal space measures 14 x 20 mm. This has low signal on T1 and T2 and is suggestive of flow void from an aneurysm. Small fleck of calcium in the wall of this lesion is seen on prior CT. This was present on the MRI of 2009. Other lesions that can occur in this space include salivary tumor and nerve sheath tumor. Asymmetric low signal below the skullbase on the right may be tortuous right jugular vein which is relatively large in dominant. Small left jugular vein noted. IMPRESSION: Acute infarct left thalamus. Atrophy and chronic ischemia 14 x 20 mm mass left parapharyngeal space. This is favored to represent an aneurysm  or pseudoaneurysm. Neoplasm such as nerve sheath tumor and salivary gland tumor considered less likely. This appears to be a chronic finding and was incompletely imaged in 2009. Consider follow-up CTA of the head and neck for further evaluation. Electronically Signed   By: Franchot Gallo M.D.   On: 08/13/2015 10:32   US Carotid Bilateral  08/13/2015  CLINICAL DATA:  CVA. EXAM: BILATERAL CAROTID DUPLEX ULTRASOUND TECHNIQUE: Pearline Cables scale imaging, color Doppler and duplex ultrasound were performed of bilateral carotid and vertebral arteries in the neck. COMPARISON:  None. FINDINGS: Criteria: Quantification of carotid stenosis is based on velocity parameters that correlate the residual internal carotid diameter with NASCET-based stenosis levels, using the diameter of the distal internal carotid lumen as the denominator for stenosis measurement. The following velocity measurements were obtained: RIGHT ICA:  59/15 cm/sec CCA:  38/75 cm/sec SYSTOLIC ICA/CCA RATIO:  1.0 DIASTOLIC ICA/CCA RATIO:  1.0 ECA:  71 cm/sec LEFT ICA:  72/16 cm/sec CCA:  64/33 cm/sec SYSTOLIC ICA/CCA RATIO:  1.0 DIASTOLIC ICA/CCA RATIO:  1.6 ECA:  48 cm/sec RIGHT CAROTID ARTERY: There is a moderate amount of eccentric  mixed echogenic plaque within distal aspect the right common carotid artery (image 10). There are no elevated peak systolic velocities within the interrogated course of the right internal carotid artery to suggest a hemodynamically significant stenosis. RIGHT VERTEBRAL ARTERY:  Antegrade flow LEFT CAROTID ARTERY: There is a minimal amount of eccentric mixed echogenic plaque within the mid and distal aspects of the left common carotid artery (images 40, 41, 44 and 45). There is a moderate to large amount of eccentric mixed echogenic plaque within the left carotid bulb (images 49 and 50), not resulting in elevated peak systolic velocities within the interrogated course of the left internal carotid artery to suggest a hemodynamically  significant stenosis. LEFT VERTEBRAL ARTERY:  Antegrade flow IMPRESSION: Moderate to large amount of bilateral atherosclerotic plaque, left greater than right, not resulting in hemodynamically significant stenosis within either internal carotid artery. Electronically Signed   By: Sandi Mariscal M.D.   On: 08/13/2015 13:04   Management plans discussed with the patient, and she is in agreement.  CODE STATUS:     Code Status Orders        Start     Ordered   08/12/15 1410  Do not attempt resuscitation (DNR)   Continuous    Question Answer Comment  In the event of cardiac or respiratory ARREST Do not call a "code blue"   In the event of cardiac or respiratory ARREST Do not perform Intubation, CPR, defibrillation or ACLS   In the event of cardiac or respiratory ARREST Use medication by any route, position, wound care, and other measures to relive pain and suffering. May use oxygen, suction and manual treatment of airway obstruction as needed for comfort.      08/12/15 1409    Advance Directive Documentation        Most Recent Value   Type of Advance Directive  Healthcare Power of Attorney   Pre-existing out of facility DNR order (yellow form or pink MOST form)     "MOST" Form in Place?        TOTAL TIME TAKING CARE OF THIS PATIENT: 35 minutes.    Loletha Grayer M.D on 08/14/2015 at 9:24 AM  Between 7am to 6pm - Pager - [phone]  After 6pm go to [email or web address] - password [email or web address] [facility]  [facility]  Office  [phone]  CC: Primary care physician; No primary care provider on file.

## 2015-11-21 ENCOUNTER — Other Ambulatory Visit: Payer: Self-pay | Admitting: *Deleted

## 2015-11-24 ENCOUNTER — Other Ambulatory Visit: Payer: Self-pay | Admitting: *Deleted

## 2015-11-24 DIAGNOSIS — C50919 Malignant neoplasm of unspecified site of unspecified female breast: Secondary | ICD-10-CM

## 2016-01-12 ENCOUNTER — Observation Stay: Payer: Medicare Other

## 2016-01-12 ENCOUNTER — Emergency Department: Payer: Medicare Other

## 2016-01-12 ENCOUNTER — Inpatient Hospital Stay
Admission: EM | Admit: 2016-01-12 | Discharge: 2016-01-16 | DRG: 064 | Disposition: A | Discharge status: Skilled Nursing Facility | Payer: Medicare Other | Attending: Internal Medicine | Admitting: Internal Medicine

## 2016-01-12 DIAGNOSIS — D696 Thrombocytopenia, unspecified: Secondary | ICD-10-CM | POA: Diagnosis present

## 2016-01-12 DIAGNOSIS — R531 Weakness: Secondary | ICD-10-CM

## 2016-01-12 DIAGNOSIS — R262 Difficulty in walking, not elsewhere classified: Secondary | ICD-10-CM

## 2016-01-12 DIAGNOSIS — R29810 Facial weakness: Secondary | ICD-10-CM | POA: Diagnosis present

## 2016-01-12 DIAGNOSIS — E785 Hyperlipidemia, unspecified: Secondary | ICD-10-CM | POA: Diagnosis present

## 2016-01-12 DIAGNOSIS — Z66 Do not resuscitate: Secondary | ICD-10-CM | POA: Diagnosis present

## 2016-01-12 DIAGNOSIS — Z79899 Other long term (current) drug therapy: Secondary | ICD-10-CM

## 2016-01-12 DIAGNOSIS — I739 Peripheral vascular disease, unspecified: Secondary | ICD-10-CM | POA: Diagnosis present

## 2016-01-12 DIAGNOSIS — R21 Rash and other nonspecific skin eruption: Secondary | ICD-10-CM

## 2016-01-12 DIAGNOSIS — F0390 Unspecified dementia without behavioral disturbance: Secondary | ICD-10-CM | POA: Diagnosis present

## 2016-01-12 DIAGNOSIS — I69351 Hemiplegia and hemiparesis following cerebral infarction affecting right dominant side: Secondary | ICD-10-CM

## 2016-01-12 DIAGNOSIS — E43 Unspecified severe protein-calorie malnutrition: Secondary | ICD-10-CM | POA: Diagnosis present

## 2016-01-12 DIAGNOSIS — I639 Cerebral infarction, unspecified: Secondary | ICD-10-CM | POA: Diagnosis not present

## 2016-01-12 DIAGNOSIS — Z7982 Long term (current) use of aspirin: Secondary | ICD-10-CM

## 2016-01-12 DIAGNOSIS — Z681 Body mass index (BMI) 19 or less, adult: Secondary | ICD-10-CM

## 2016-01-12 HISTORY — DX: Cerebral infarction, unspecified: I63.9

## 2016-01-12 LAB — URINALYSIS COMPLETE WITH MICROSCOPIC (ARMC ONLY)
Bacteria, UA: NONE SEEN
Bilirubin Urine: NEGATIVE
Glucose, UA: NEGATIVE mg/dL
Hgb urine dipstick: NEGATIVE
Ketones, ur: NEGATIVE mg/dL
Leukocytes, UA: NEGATIVE
Nitrite: NEGATIVE
PH: 7 (ref 5.0–8.0)
PROTEIN: NEGATIVE mg/dL
Specific Gravity, Urine: 1.011 (ref 1.005–1.030)

## 2016-01-12 LAB — CBC
HCT: 36.1 % (ref 35.0–47.0)
HEMOGLOBIN: 12.3 g/dL (ref 12.0–16.0)
MCH: 32.1 pg (ref 26.0–34.0)
MCHC: 34 g/dL (ref 32.0–36.0)
MCV: 94.3 fL (ref 80.0–100.0)
Platelets: 91 10*3/uL — ABNORMAL LOW (ref 150–440)
RBC: 3.83 MIL/uL (ref 3.80–5.20)
RDW: 14.1 % (ref 11.5–14.5)
WBC: 7.9 10*3/uL (ref 3.6–11.0)

## 2016-01-12 LAB — BASIC METABOLIC PANEL
ANION GAP: 5 (ref 5–15)
BUN: 11 mg/dL (ref 6–20)
CHLORIDE: 107 mmol/L (ref 101–111)
CO2: 25 mmol/L (ref 22–32)
Calcium: 10 mg/dL (ref 8.9–10.3)
Creatinine, Ser: 0.75 mg/dL (ref 0.44–1.00)
GFR calc non Af Amer: >60 mL/min (ref >60)
Glucose, Bld: 116 mg/dL — ABNORMAL HIGH (ref 65–99)
Potassium: 3.9 mmol/L (ref 3.5–5.1)
Sodium: 137 mmol/L (ref 135–145)

## 2016-01-12 LAB — CK: CK TOTAL: 40 U/L (ref 38–234)

## 2016-01-12 MED ORDER — DULOXETINE HCL 60 MG PO CPEP
60.0000 mg | ORAL_CAPSULE | Freq: Every day | ORAL | Status: DC
  Administered 2016-01-12 – 2016-01-16 (×5): 60 mg via ORAL
  Filled 2016-01-12 (×5): qty 1

## 2016-01-12 MED ORDER — ONDANSETRON HCL 4 MG PO TABS: 4.0000 mg | ORAL_TABLET | Freq: Four times a day (QID) | ORAL | Status: DC | PRN

## 2016-01-12 MED ORDER — ENOXAPARIN SODIUM 40 MG/0.4ML ~~LOC~~ SOLN
40.0000 mg | SUBCUTANEOUS | Status: DC
  Administered 2016-01-12: 40 mg via SUBCUTANEOUS
  Filled 2016-01-12: qty 0.4

## 2016-01-12 MED ORDER — ACETAMINOPHEN 650 MG RE SUPP: 650.0000 mg | Freq: Four times a day (QID) | RECTAL | Status: DC | PRN

## 2016-01-12 MED ORDER — CALCIUM CARBONATE-VITAMIN D 500-200 MG-UNIT PO TABS
1.0000 | ORAL_TABLET | Freq: Every day | ORAL | Status: DC
  Administered 2016-01-12 – 2016-01-16 (×5): 1 via ORAL
  Filled 2016-01-12 (×5): qty 1

## 2016-01-12 MED ORDER — DONEPEZIL HCL 5 MG PO TABS
10.0000 mg | ORAL_TABLET | Freq: Every day | ORAL | Status: DC
  Administered 2016-01-12 – 2016-01-15 (×4): 10 mg via ORAL
  Filled 2016-01-12 (×4): qty 2

## 2016-01-12 MED ORDER — ACETAMINOPHEN 325 MG PO TABS: 650.0000 mg | ORAL_TABLET | Freq: Four times a day (QID) | ORAL | Status: DC | PRN

## 2016-01-12 MED ORDER — ONDANSETRON HCL 4 MG/2ML IJ SOLN
4.0000 mg | Freq: Four times a day (QID) | INTRAMUSCULAR | Status: DC | PRN
  Administered 2016-01-12: 4 mg via INTRAVENOUS
  Filled 2016-01-12: qty 2

## 2016-01-12 MED ORDER — SODIUM CHLORIDE 0.9% FLUSH
3.0000 mL | Freq: Two times a day (BID) | INTRAVENOUS | Status: DC
  Administered 2016-01-12 – 2016-01-16 (×8): 3 mL via INTRAVENOUS

## 2016-01-12 MED ORDER — ASPIRIN EC 81 MG PO TBEC
81.0000 mg | DELAYED_RELEASE_TABLET | Freq: Every day | ORAL | Status: DC
  Administered 2016-01-12 – 2016-01-13 (×2): 81 mg via ORAL
  Filled 2016-01-12 (×2): qty 1

## 2016-01-12 MED ORDER — DOCUSATE SODIUM 100 MG PO CAPS
100.0000 mg | ORAL_CAPSULE | Freq: Two times a day (BID) | ORAL | Status: DC
  Administered 2016-01-12 – 2016-01-16 (×8): 100 mg via ORAL
  Filled 2016-01-12 (×8): qty 1

## 2016-01-12 MED ORDER — DIAZEPAM 5 MG PO TABS: 5.0000 mg | ORAL_TABLET | Freq: Every evening | ORAL | Status: DC | PRN

## 2016-01-12 NOTE — ED Notes (Signed)
Report to [place], [place]

## 2016-01-12 NOTE — ED Notes (Signed)
Pt to MRI

## 2016-01-12 NOTE — ED Notes (Signed)
Pt arrives to ER via [facility] from home with cc of weakness. Husband unable to get patient out of the bed this AM. Pt fall yesterday on bottom, denies injury from fall. Pt alert and oriented X 4 at this time. Previous hx of stroke in November of 2016. Pt alert and oriented X4, active, cooperative, pt in NAD. RR even and unlabored, color WNL.

## 2016-01-12 NOTE — ED Provider Notes (Signed)
[facility] Emergency Department Provider Note  ____________________________________________  Time seen: Approximately 11:20 AM  I have reviewed the triage vital signs and the nursing notes.   HISTORY  Chief Complaint Weakness    HPI Savannah Lee is a 82 y.o. 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 Patient's husband reports that she has had several weeks of progressive decline with her strength, but until yesterday she was able to ambulate and perform her ADLs. After church yesterday, he had to hold her up so that she wouldn't fall from generalized weakness. She sustained a mechanical fall when trying to stand up from her chair because she was "weak all over." She did not hit her head, nor did she have any loss of consciousness. No lightheadedness or syncope, chest pain, shortness of breath, palpitations. Today, she was completely unable to get out of bed without full-body assistance. She is not recently had any illness including fever or chills, nausea vomiting or diarrhea, nor has she had any changes in her medications. She does not have any pain at this time. Per her husband, her mental status is grossly unchanged from baseline.   Past Medical History  Diagnosis Date  . Dementia   . TIA (transient ischemic attack)   . Cancer ([place])   . Stroke [facility])     Patient Active Problem List   Diagnosis Date Noted  . CVA (cerebral infarction) 08/12/2015    Past Surgical History  Procedure Laterality Date  . Abdominal hysterectomy    . Breast surgery    . Cataract extraction      Current Outpatient Rx  Name  Route  Sig  Dispense  Refill  . aspirin EC 81 MG tablet   Oral   Take 1 tablet (81 mg total) by mouth daily.   30 tablet   0   . atorvastatin (LIPITOR) 40 MG tablet   Oral   Take 1 tablet (40 mg total) by mouth daily at 6 PM.   30 tablet   0   . calcium  citrate-vitamin D (CITRACAL+D) 315-200 MG-UNIT tablet   Oral   Take 1 tablet by mouth daily.         . diazepam (VALIUM) 5 MG tablet   Oral   Take 5 mg by mouth daily as needed for muscle spasms.          . diphenhydrAMINE (BENADRYL) 25 MG tablet   Oral   Take 25 mg by mouth at bedtime as needed for allergies.          Marland Kitchen donepezil (ARICEPT) 5 MG tablet   Oral   Take 5 mg by mouth every evening.         . DULoxetine (CYMBALTA) 60 MG capsule   Oral   Take 60 mg by mouth daily.         Marland Kitchen HYDROcodone-acetaminophen (NORCO) 7.5-325 MG tablet   Oral   Take 0.5-1 tablets by mouth 3 (three) times daily as needed for moderate pain or severe pain.          . Multiple Vitamins-Minerals (CENTRUM SILVER PO)   Oral   Take 1 tablet by mouth daily.           Allergies Review of patient's allergies indicates no known allergies.  Family History  Problem Relation Age of Onset  . Cancer Father     Social History Social History  Substance Use Topics  .  Smoking status: Never Smoker   . Smokeless tobacco: None  . Alcohol Use: No    Review of Systems Constitutional: No fever/chills. No lightheadedness. No syncope. Plus generalized weakness Eyes: No visual changes. No blurred or double vision. ENT: No sore throat. No congestion or rhinorrhea. Cardiovascular: Denies chest pain. Denies palpitations. Respiratory: Denies shortness of breath.  No cough. Gastrointestinal: No abdominal pain.  No nausea, no vomiting.  No diarrhea.  No constipation. Genitourinary: Negative for dysuria. Musculoskeletal: Negative for back pain. No neck pain. No hip pain. Skin: Negative for rash. Neurological: Negative for headaches. No focal numbness, tingling or weakness. Residual right upper extremity weakness that is chronic.  10-point ROS otherwise negative.  ____________________________________________   PHYSICAL EXAM:  VITAL SIGNS: ED Triage Vitals  Enc Vitals Group     BP 01/12/16  1033 152/80 mmHg     Pulse Rate 01/12/16 1033 79     Resp 01/12/16 1033 12     Temp 01/12/16 1033 98.8 F (37.1 C)     Temp Source 01/12/16 1033 Oral     SpO2 01/12/16 1033 99 %     Weight 01/12/16 1033 100 lb (45.36 kg)     Height 01/12/16 1033 5' (1.524 m)     Head Cir --      Peak Flow --      Pain Score --      Pain Loc --      Pain Edu? --      Excl. in [place]? --     Constitutional: Patient is alert and oriented to person. She does not know the year or the month. Well appearing and in no acute distress.  Eyes: Conjunctivae are normal.  EOMI. No scleral icterus. PERRLA. Head: Atraumatic. No Battle sign or raccoon eyes. Nose: No congestion/rhinnorhea. No swelling over the nose sprayed no septal hematoma. Mouth/Throat: Mucous membranes are moist. No malocclusion or dental injury. Neck: No stridor.  Supple.  No JVD. No midline C-spine tenderness to palpation, step-offs or deformities. Cardiovascular: Normal rate, regular rhythm. No murmurs, rubs or gallops.  Respiratory: Normal respiratory effort.  No accessory muscle use or retractions. Lungs CTAB.  No wheezes, rales or ronchi. Gastrointestinal: Soft, nontender and nondistended.  No guarding or rebound.  No peritoneal signs. Musculoskeletal: No LE edema. No ttp in the calves or palpable cords.  Negative Homan's sign. Pelvis is stable. Full range of motion of the bilateral hips without pain. No tenderness to palpation in the thoracic or lumbar spine, no step-offs or deformities. Neurologic: Alert and oriented 1. Speech is clear.  Left facial droop with asymmetric smile.. Tongue is midline. No pronator drift. 5 out of 5 grip, biceps, triceps, hip flexors, plantar flexion and dorsiflexion. Normal sensation to light touch in the bilateral upper and lower extremities, and face.  Skin:  Skin is warm, dry and intact. No rash noted. 3 x 2 cm ecchymosis over the right anterior tibia without skin break. Psychiatric: Mood and affect are normal.  Speech and behavior are normal.  Normal judgement.  ____________________________________________   LABS (all labs ordered are listed, but only abnormal results are displayed)  Labs Reviewed  BASIC METABOLIC PANEL - Abnormal; Notable for the following:    Glucose, Bld 116 (*)    All other components within normal limits  CBC - Abnormal; Notable for the following:    Platelets 91 (*)    All other components within normal limits  URINALYSIS COMPLETEWITH MICROSCOPIC ([facility] ONLY) - Abnormal; Notable for the  following:    Color, Urine YELLOW (*)    APPearance CLEAR (*)    Squamous Epithelial / LPF 0-5 (*)    All other components within normal limits   ____________________________________________  EKG  ED ECG REPORT I, Eula Listen, the attending physician, personally viewed and interpreted this ECG.   Date: 01/12/2016  EKG Time: 1029  Rate: 77  Rhythm: normal sinus rhythm  Axis: Normal  Intervals:none  ST&T Change: No ST elevation. No ischemic changes.  ____________________________________________  RADIOLOGY  Ct Head Wo Contrast  01/12/2016  CLINICAL DATA:  82 year old female with generalized weakness for 2 days. Initial encounter. EXAM: CT HEAD WITHOUT CONTRAST TECHNIQUE: Contiguous axial images were obtained from the base of the skull through the vertex without intravenous contrast. COMPARISON:  08/13/2015. FINDINGS: No skull fracture or intracranial hemorrhage. Remote left thalamic infarct. Moderate chronic small vessel disease changes. No CT evidence of large acute infarct. Vascular calcifications. Mild global atrophy without hydrocephalus. Visualized orbits unremarkable. IMPRESSION: No intracranial hemorrhage or CT evidence of large acute infarct. Remote left thalamic infarct. Chronic small vessel disease changes. Mild global atrophy without hydrocephalus. Electronically Signed   By: Genia Del M.D.   On: 01/12/2016 13:03   Dg Chest Portable 1  View  01/12/2016  CLINICAL DATA:  82 year old female with weakness. Fell yesterday on buttocks. Denies injury. Initial encounter. EXAM: PORTABLE CHEST 1 VIEW COMPARISON:  12/23/2014. FINDINGS: No infiltrate, congestive heart failure or pneumothorax. Calcified markedly tortuous aorta. Aneurysm not excluded. Similar appearance to prior exam. Heart size within normal limits. Left shoulder degenerative changes. IMPRESSION: Clear lungs. Calcified markedly tortuous aorta. Aneurysm not excluded. Similar appearance to prior exam. Electronically Signed   By: Genia Del M.D.   On: 01/12/2016 11:35    ____________________________________________   PROCEDURES  Procedure(s) performed: None  Critical Care performed: No ____________________________________________   INITIAL IMPRESSION / ASSESSMENT AND PLAN / ED COURSE  Pertinent labs & imaging results that were available during my care of the patient were reviewed by me and considered in my medical decision making (see chart for details).  82 y.o. female with a history of CVA presenting with 2 days of progressively worsening generalized weakness and now unable to stand on her own. On my exam, she has the isolated finding of a left facial droop, although it is unclear whether this is residual from her previous stroke or new finding. She does not have any other motor or sensory deficits. She is not been having any other symptoms, but I will evaluate her for arrhythmia, ACS or MI, UTI, electrolyte imbalance, dehydration or orthostasis.  ----------------------------------------- 1:26 PM on 01/12/2016 -----------------------------------------  The patient's workup in the emergency department has been reassuring. Her CT scan does not show any acute process. Her urine does not show UTI and the rest of her labs are reassuring. However, the patient is unable to ambulate and has a new left facial droop. I have reviewed the clinical examination notes from  November, and she did not have a left facial droop on her neurologic examination at that time. I will plan admission to the hospital. ____________________________________________  FINAL CLINICAL IMPRESSION(S) / ED DIAGNOSES  Final diagnoses:  Generalized weakness  Facial droop  Inability to walk      NEW MEDICATIONS STARTED DURING THIS VISIT:  New Prescriptions   No medications on file     Eula Listen, MD 01/12/16 1328

## 2016-01-12 NOTE — H&P (Signed)
[facility] NAME: Savannah Lee    MR#:  [id number]  DATE OF BIRTH:  [birth year]  DATE OF ADMISSION:  01/12/2016  PRIMARY CARE PHYSICIAN: Albina Billet, MD   REQUESTING/REFERRING PHYSICIAN:   CHIEF COMPLAINT:   Chief Complaint  Patient presents with  . Weakness    HISTORY OF PRESENT ILLNESS: Savannah Lee  is a 82 y.o. female with a known history of Dementia, TIA, stroke, which left patient with right upper and right lower extremity weakness, chronic, who presents to the hospital with complaints of 1 day history of weakness, unsteady gait, fall. Patient denies any other problems except of weakness, generalized, denies any lateralized problems. In emergency room, she was noted to have left facial droop, which resolved by the time I arrived. Patient's son is not able to take care of his mother due to significant weakness and requires admission to the hospital. Hospice services were contacted for admission  PAST MEDICAL HISTORY:   Past Medical History  Diagnosis Date  . Dementia   . TIA (transient ischemic attack)   . Cancer ([place])   . Stroke [facility])     PAST SURGICAL HISTORY: Past Surgical History  Procedure Laterality Date  . Abdominal hysterectomy    . Breast surgery    . Cataract extraction      SOCIAL HISTORY:  Social History  Substance Use Topics  . Smoking status: Never Smoker   . Smokeless tobacco: Not on file  . Alcohol Use: No    FAMILY HISTORY:  Family History  Problem Relation Age of Onset  . Cancer Father     DRUG ALLERGIES: No Known Allergies  Review of Systems  Constitutional: Negative for fever, chills, weight loss and malaise/fatigue.  HENT: Negative for congestion.   Eyes: Negative for blurred vision and double vision.  Respiratory: Negative for cough, sputum production, shortness of breath and wheezing.   Cardiovascular: Negative for chest pain, palpitations, orthopnea, leg swelling and PND.   Gastrointestinal: Negative for nausea, vomiting, abdominal pain, diarrhea, constipation, blood in stool and melena.  Genitourinary: Negative for dysuria, urgency, frequency and hematuria.  Musculoskeletal: Negative for falls.  Skin: Negative for rash.  Neurological: Positive for weakness. Negative for dizziness.  Psychiatric/Behavioral: Negative for depression and memory loss. The patient is not nervous/anxious.     MEDICATIONS AT HOME:  Prior to Admission medications   Medication Sig Start Date End Date Taking? Authorizing Provider  aspirin EC 81 MG tablet Take 1 tablet (81 mg total) by mouth daily. 08/14/15  Yes Loletha Grayer, MD  atorvastatin (LIPITOR) 40 MG tablet Take 40 mg by mouth at bedtime.   Yes Historical Provider, MD  Calcium Carbonate-Vitamin D (CALCIUM 600+D) 600-400 MG-UNIT tablet Take 1 tablet by mouth daily.   Yes Historical Provider, MD  diazepam (VALIUM) 5 MG tablet Take 5 mg by mouth at bedtime as needed for muscle spasms.    Yes Historical Provider, MD  diphenhydrAMINE (BENADRYL) 25 MG tablet Take 25 mg by mouth at bedtime as needed for allergies.    Yes Historical Provider, MD  donepezil (ARICEPT) 10 MG tablet Take 10 mg by mouth at bedtime.   Yes Historical Provider, MD  DULoxetine (CYMBALTA) 60 MG capsule Take 60 mg by mouth daily.   Yes Historical Provider, MD  HYDROcodone-acetaminophen (NORCO) 7.5-325 MG tablet Take 0.5-1 tablets by mouth 3 (three) times daily as needed for moderate pain.    Yes Historical Provider, MD  Multiple Vitamin (  MULTIVITAMIN WITH MINERALS) TABS tablet Take 1 tablet by mouth daily.   Yes Historical Provider, MD      PHYSICAL EXAMINATION:   VITAL SIGNS: Blood pressure 156/84, pulse 68, temperature 98.8 F (37.1 C), temperature source Oral, resp. rate 14, height 5' (1.524 m), weight 45.36 kg (100 lb), SpO2 96 %.  GENERAL:  82 y.o.-year-old patient lying in the bed with no acute distress.  EYES: Pupils equal, round, reactive to light and  accommodation. No scleral icterus. Extraocular muscles intact.  HEENT: Head atraumatic, normocephalic. Oropharynx and nasopharynx clear.  NECK:  Supple, no jugular venous distention. No thyroid enlargement, no tenderness.  LUNGS: Normal breath sounds bilaterally, no wheezing, rales,rhonchi or crepitation. No use of accessory muscles of respiration.  CARDIOVASCULAR: S1, S2 normal. No murmurs, rubs, or gallops.  ABDOMEN: Soft, nontender, nondistended. Bowel sounds present. No organomegaly or mass.  EXTREMITIES: No pedal edema, cyanosis, or clubbing.  NEUROLOGIC: Cranial nerves II through XII are Grossly intact, mild right facial weakness was noted while puffing cheeks. Muscle strength 4/5 in right sided extremities, 5 out of 5 in left-sided extremities. Sensation intact. Gait not checked.  PSYCHIATRIC: The patient is alert and oriented x 3.  SKIN: Rash in the upper back was noted, nonblanching, petechial. No other lesions, or ulcers noted. She does have numerous bruises in upper extremities and lower extremities.   LABORATORY PANEL:   CBC  Recent Labs Lab 01/12/16 1051  WBC 7.9  HGB 12.3  HCT 36.1  PLT 91*  MCV 94.3  MCH 32.1  MCHC 34.0  RDW 14.1   ------------------------------------------------------------------------------------------------------------------  Chemistries   Recent Labs Lab 01/12/16 1051  NA 137  K 3.9  CL 107  CO2 25  GLUCOSE 116*  BUN 11  CREATININE 0.75  CALCIUM 10.0   ------------------------------------------------------------------------------------------------------------------  Cardiac Enzymes No results for input(s): TROPONINI in the last 168 hours. ------------------------------------------------------------------------------------------------------------------  RADIOLOGY: Ct Head Wo Contrast  01/12/2016  CLINICAL DATA:  82 year old female with generalized weakness for 2 days. Initial encounter. EXAM: CT HEAD WITHOUT CONTRAST  TECHNIQUE: Contiguous axial images were obtained from the base of the skull through the vertex without intravenous contrast. COMPARISON:  08/13/2015. FINDINGS: No skull fracture or intracranial hemorrhage. Remote left thalamic infarct. Moderate chronic small vessel disease changes. No CT evidence of large acute infarct. Vascular calcifications. Mild global atrophy without hydrocephalus. Visualized orbits unremarkable. IMPRESSION: No intracranial hemorrhage or CT evidence of large acute infarct. Remote left thalamic infarct. Chronic small vessel disease changes. Mild global atrophy without hydrocephalus. Electronically Signed   By: Genia Del M.D.   On: 01/12/2016 13:03   Dg Chest Portable 1 View  01/12/2016  CLINICAL DATA:  82 year old female with weakness. Fell yesterday on buttocks. Denies injury. Initial encounter. EXAM: PORTABLE CHEST 1 VIEW COMPARISON:  12/23/2014. FINDINGS: No infiltrate, congestive heart failure or pneumothorax. Calcified markedly tortuous aorta. Aneurysm not excluded. Similar appearance to prior exam. Heart size within normal limits. Left shoulder degenerative changes. IMPRESSION: Clear lungs. Calcified markedly tortuous aorta. Aneurysm not excluded. Similar appearance to prior exam. Electronically Signed   By: Genia Del M.D.   On: 01/12/2016 11:35    EKG: Orders placed or performed during the hospital encounter of 01/12/16  . ED EKG  . ED EKG  . EKG 12-Lead  . EKG 12-Lead  Sinus rhythm at 77 bpm, nonspecific intraventricular conduction delay, no acute ST-T changes  IMPRESSION AND PLAN:  Principal Problem:   Generalized weakness Active Problems:   Facial weakness  Rash   Thrombocytopenia ([place]) #1 generalized weakness, etiology is unclear at this time, admit patient, medical floor for observation, get physical therapist evaluation. Get CK level and hold Lipitor #2. Left facial weakness, not seen during my evaluation, rule out new stroke, get MRI of the brain,  continue aspirin.  #3 rash of unclear etiology, supportive therapy  #4. Thrombocytopenia, chronic, follow with therapy   All the records are reviewed and case discussed with ED provider. Management plans discussed with the patient, family and they are in agreement.  CODE STATUS: Code Status History    Date Active Date Inactive Code Status Order ID Comments User Context   08/12/2015  2:09 PM 08/14/2015  5:29 PM DNR [id number]  Vaughan Basta, MD ED    Questions for Most Recent Historical Code Status (Order [id number])    Question Answer Comment   In the event of cardiac or respiratory ARREST Do not call a "code blue"    In the event of cardiac or respiratory ARREST Do not perform Intubation, CPR, defibrillation or ACLS    In the event of cardiac or respiratory ARREST Use medication by any route, position, wound care, and other measures to relive pain and suffering. May use oxygen, suction and manual treatment of airway obstruction as needed for comfort.        TOTAL TIME TAKING CARE OF THIS PATIENT: 50  minutes.    Theodoro Grist M.D on 01/12/2016 at 2:40 PM  Between 7am to 6pm - Pager - [phone] After 6pm go to [email or web address] - password [email or web address] [facility]  Office  [phone]  CC: Primary care physician; Albina Billet, MD

## 2016-01-13 ENCOUNTER — Inpatient Hospital Stay: Payer: Medicare Other

## 2016-01-13 DIAGNOSIS — Z66 Do not resuscitate: Secondary | ICD-10-CM | POA: Diagnosis present

## 2016-01-13 DIAGNOSIS — Z79899 Other long term (current) drug therapy: Secondary | ICD-10-CM | POA: Diagnosis not present

## 2016-01-13 DIAGNOSIS — I639 Cerebral infarction, unspecified: Principal | ICD-10-CM

## 2016-01-13 DIAGNOSIS — R21 Rash and other nonspecific skin eruption: Secondary | ICD-10-CM | POA: Diagnosis present

## 2016-01-13 DIAGNOSIS — I69351 Hemiplegia and hemiparesis following cerebral infarction affecting right dominant side: Secondary | ICD-10-CM | POA: Diagnosis not present

## 2016-01-13 DIAGNOSIS — E43 Unspecified severe protein-calorie malnutrition: Secondary | ICD-10-CM | POA: Diagnosis present

## 2016-01-13 DIAGNOSIS — D696 Thrombocytopenia, unspecified: Secondary | ICD-10-CM | POA: Diagnosis present

## 2016-01-13 DIAGNOSIS — F0390 Unspecified dementia without behavioral disturbance: Secondary | ICD-10-CM | POA: Diagnosis present

## 2016-01-13 DIAGNOSIS — E785 Hyperlipidemia, unspecified: Secondary | ICD-10-CM | POA: Diagnosis present

## 2016-01-13 DIAGNOSIS — I739 Peripheral vascular disease, unspecified: Secondary | ICD-10-CM | POA: Diagnosis present

## 2016-01-13 DIAGNOSIS — R531 Weakness: Secondary | ICD-10-CM

## 2016-01-13 DIAGNOSIS — R29810 Facial weakness: Secondary | ICD-10-CM | POA: Diagnosis present

## 2016-01-13 DIAGNOSIS — Z681 Body mass index (BMI) 19 or less, adult: Secondary | ICD-10-CM | POA: Diagnosis not present

## 2016-01-13 DIAGNOSIS — I6350 Cerebral infarction due to unspecified occlusion or stenosis of unspecified cerebral artery: Secondary | ICD-10-CM | POA: Diagnosis not present

## 2016-01-13 DIAGNOSIS — Z7982 Long term (current) use of aspirin: Secondary | ICD-10-CM | POA: Diagnosis not present

## 2016-01-13 LAB — CBC
HEMATOCRIT: 33.5 % — AB (ref 35.0–47.0)
HEMOGLOBIN: 11.5 g/dL — AB (ref 12.0–16.0)
MCH: 31.9 pg (ref 26.0–34.0)
MCHC: 34.2 g/dL (ref 32.0–36.0)
MCV: 93.2 fL (ref 80.0–100.0)
Platelets: 87 10*3/uL — ABNORMAL LOW (ref 150–440)
RBC: 3.6 MIL/uL — ABNORMAL LOW (ref 3.80–5.20)
RDW: 13.9 % (ref 11.5–14.5)
WBC: 7.5 10*3/uL (ref 3.6–11.0)

## 2016-01-13 LAB — BASIC METABOLIC PANEL
ANION GAP: 4 — AB (ref 5–15)
BUN: 12 mg/dL (ref 6–20)
CHLORIDE: 107 mmol/L (ref 101–111)
CO2: 25 mmol/L (ref 22–32)
Calcium: 9.6 mg/dL (ref 8.9–10.3)
Creatinine, Ser: 0.73 mg/dL (ref 0.44–1.00)
GFR calc Af Amer: >60 mL/min (ref >60)
Glucose, Bld: 99 mg/dL (ref 65–99)
POTASSIUM: 3.5 mmol/L (ref 3.5–5.1)
SODIUM: 136 mmol/L (ref 135–145)

## 2016-01-13 LAB — HEMOGLOBIN A1C: Hgb A1c MFr Bld: 4.8 % (ref 4.0–6.0)

## 2016-01-13 MED ORDER — ENOXAPARIN SODIUM 30 MG/0.3ML ~~LOC~~ SOLN
30.0000 mg | SUBCUTANEOUS | Status: DC
  Administered 2016-01-13 – 2016-01-15 (×3): 30 mg via SUBCUTANEOUS
  Filled 2016-01-13 (×3): qty 0.3

## 2016-01-13 MED ORDER — ENSURE ENLIVE PO LIQD
237.0000 mL | Freq: Three times a day (TID) | ORAL | Status: DC
  Administered 2016-01-13 – 2016-01-16 (×10): 237 mL via ORAL

## 2016-01-13 MED ORDER — CLOPIDOGREL BISULFATE 75 MG PO TABS
75.0000 mg | ORAL_TABLET | Freq: Every day | ORAL | Status: DC
  Administered 2016-01-13 – 2016-01-16 (×4): 75 mg via ORAL
  Filled 2016-01-13 (×3): qty 1

## 2016-01-13 MED ORDER — ATORVASTATIN CALCIUM 20 MG PO TABS
40.0000 mg | ORAL_TABLET | Freq: Every day | ORAL | Status: DC
  Administered 2016-01-13 – 2016-01-15 (×3): 40 mg via ORAL
  Filled 2016-01-13 (×3): qty 2

## 2016-01-13 NOTE — Consult Note (Signed)
Referring Physician: Manuella Ghazi    Chief Complaint: Weakness, facial droop  HPI: Savannah Lee is an 82 y.o. female with a past history of stroke with residual right sided weakness who presented to the hospital with a one day history of generalized weakness and unsteady gait and fall.  It appears that for the past 1-2 weeks the patient has been steadily going downhill but has been able to ambulate.  On the day of admission patient was unable to ambulate and was brought in for evaluation.  Was noted to have a facial droop in the ED.    Date last known well: Unable to determine Time last known well: Unable to determine tPA Given: No: Unable to determine LKW  Modified Rankin: Rankin Score=4  Past Medical History  Diagnosis Date  . Dementia   . TIA (transient ischemic attack)   . Cancer ([place])   . Stroke [facility])     Past Surgical History  Procedure Laterality Date  . Abdominal hysterectomy    . Breast surgery    . Cataract extraction      Family History  Problem Relation Age of Onset  . Cancer Father    Social History:  reports that she has never smoked. She does not have any smokeless tobacco history on file. She reports that she does not drink alcohol. Her drug history is not on file.  Allergies: No Known Allergies  Medications:  I have reviewed the patient's current medications. Prior to Admission:  Prescriptions prior to admission  Medication Sig Dispense Refill Last Dose  . aspirin EC 81 MG tablet Take 1 tablet (81 mg total) by mouth daily. 30 tablet 0 01/11/2016 at 0900  . atorvastatin (LIPITOR) 40 MG tablet Take 40 mg by mouth at bedtime.   01/11/2016 at Unknown time  . Calcium Carbonate-Vitamin D (CALCIUM 600+D) 600-400 MG-UNIT tablet Take 1 tablet by mouth daily.   01/11/2016 at Unknown time  . diazepam (VALIUM) 5 MG tablet Take 5 mg by mouth at bedtime as needed for muscle spasms.    01/11/2016 at Unknown time  . diphenhydrAMINE (BENADRYL) 25 MG tablet Take 25 mg by mouth at bedtime as  needed for allergies.    01/11/2016 at Unknown time  . donepezil (ARICEPT) 10 MG tablet Take 10 mg by mouth at bedtime.   01/11/2016 at Unknown time  . DULoxetine (CYMBALTA) 60 MG capsule Take 60 mg by mouth daily.   01/11/2016 at Unknown time  . HYDROcodone-acetaminophen (NORCO) 7.5-325 MG tablet Take 0.5-1 tablets by mouth 3 (three) times daily as needed for moderate pain.    Past Month at Unknown time  . Multiple Vitamin (MULTIVITAMIN WITH MINERALS) TABS tablet Take 1 tablet by mouth daily.   01/11/2016 at Unknown time   Scheduled: . aspirin EC  81 mg Oral Daily  . calcium-vitamin D  1 tablet Oral Daily  . docusate sodium  100 mg Oral BID  . donepezil  10 mg Oral QHS  . DULoxetine  60 mg Oral Daily  . enoxaparin (LOVENOX) injection  30 mg Subcutaneous Q24H  . sodium chloride flush  3 mL Intravenous Q12H    ROS: History obtained from the patient  General ROS: negative for - chills, fatigue, fever, night sweats, weight gain or weight loss Psychological ROS: negative for - behavioral disorder, hallucinations, memory difficulties, mood swings or suicidal ideation Ophthalmic ROS: negative for - blurry vision, double vision, eye pain or loss of vision ENT ROS: negative for - epistaxis, nasal discharge,  oral lesions, sore throat, tinnitus or vertigo Allergy and Immunology ROS: negative for - hives or itchy/watery eyes Hematological and Lymphatic ROS: negative for - bleeding problems, bruising or swollen lymph nodes Endocrine ROS: negative for - galactorrhea, hair pattern changes, polydipsia/polyuria or temperature intolerance Respiratory ROS: negative for - cough, hemoptysis, shortness of breath or wheezing Cardiovascular ROS: negative for - chest pain, dyspnea on exertion, edema or irregular heartbeat Gastrointestinal ROS: negative for - abdominal pain, diarrhea, hematemesis, nausea/vomiting or stool incontinence Genito-Urinary ROS: negative for - dysuria, hematuria, incontinence or urinary  frequency/urgency Musculoskeletal ROS: negative for - joint swelling or muscular weakness Neurological ROS: as noted in HPI Dermatological ROS: negative for rash and skin lesion changes  Physical Examination: Blood pressure 136/67, pulse 76, temperature 98.1 F (36.7 C), temperature source Oral, resp. rate 18, height 5\' 2"  (1.575 m), weight 42.23 kg (93 lb 1.6 oz), SpO2 96 %.  HEENT-  Normocephalic, no lesions, without obvious abnormality.  Normal external eye and conjunctiva.  Normal TM's bilaterally.  Normal auditory canals and external ears. Normal external nose, mucus membranes and septum.  Normal pharynx. Cardiovascular- S1, S2 normal, pulses palpable throughout   Lungs- chest clear, no wheezing, rales, normal symmetric air entry Abdomen- soft, non-tender; bowel sounds normal; no masses,  no organomegaly Extremities- no edema Lymph-no adenopathy palpable Musculoskeletal-no joint tenderness, deformity or swelling Skin-warm and dry, no hyperpigmentation, vitiligo, or suspicious lesions  Neurological Examination Mental Status: Alert.  Reports that it is March of 1976.  Speech fluent without evidence of aphasia.  Needs reinforcement to follow 3-step commands.  Left neglect.   Cranial Nerves: II: Discs flat bilaterally; Visual fields grossly normal, pupils unequal, irregular, and unreactive to light III,IV, VI: ptosis not present, unable to go beyond midline to the left. V,VII: smile symmetric, facial light touch sensation normal bilaterally VIII: hearing normal bilaterally IX,X: gag reflex present XI: bilateral shoulder shrug XII: midline tongue extension Motor: Right : Upper extremity   5/5    Left:     Upper extremity   4+-5-/5  Lower extremity   5/5     Lower extremity   5/5 Tone and bulk:normal tone throughout; no atrophy noted Sensory: Pinprick and light touch intact throughout, bilaterally Deep Tendon Reflexes: 2+ and symmetric with absent AJ's bilaterally Plantars: Right:  upgoing   Left: downgoing Cerebellar: Dysmetria with right finger-to-nose and left heel-to-shin testing Gait: not tested due to safety concerns   Laboratory Studies:  Basic Metabolic Panel:  Recent Labs Lab 01/12/16 1051 01/13/16 0436  NA 137 136  K 3.9 3.5  CL 107 107  CO2 25 25  GLUCOSE 116* 99  BUN 11 12  CREATININE 0.75 0.73  CALCIUM 10.0 9.6    Liver Function Tests: No results for input(s): AST, ALT, ALKPHOS, BILITOT, PROT, ALBUMIN in the last 168 hours. No results for input(s): LIPASE, AMYLASE in the last 168 hours. No results for input(s): AMMONIA in the last 168 hours.  CBC:  Recent Labs Lab 01/12/16 1051 01/13/16 0436  WBC 7.9 7.5  HGB 12.3 11.5*  HCT 36.1 33.5*  MCV 94.3 93.2  PLT 91* 87*    Cardiac Enzymes:  Recent Labs Lab 01/12/16 1051  CKTOTAL 40    BNP: Invalid input(s): POCBNP  CBG: No results for input(s): GLUCAP in the last 168 hours.  Microbiology: No results found for this or any previous visit.  Coagulation Studies: No results for input(s): LABPROT, INR in the last 72 hours.  Urinalysis:  Recent Labs Lab  01/12/16 1123  [place] 1.011  PHURINE 7.0  GLUCOSEU NEGATIVE  HGBUR NEGATIVE  BILIRUBINUR NEGATIVE  KETONESUR NEGATIVE  PROTEINUR NEGATIVE  NITRITE NEGATIVE  LEUKOCYTESUR NEGATIVE    Lipid Panel:    Component Value Date/Time   CHOL 226* 08/12/2015 1615   TRIG 146 08/12/2015 1615   HDL 63 08/12/2015 1615   CHOLHDL 3.6 08/12/2015 1615   VLDL 29 08/12/2015 1615   LDLCALC 134* 08/12/2015 1615    HgbA1C: No results found for: HGBA1C  Urine Drug Screen:  No results found for: LABOPIA, COCAINSCRNUR, LABBENZ, AMPHETMU, THCU, LABBARB  Alcohol Level: No results for input(s): ETH in the last 168 hours.  Other results: EKG: sinus rhythm at 77 bpm.  Imaging: Ct Head Wo Contrast  01/12/2016  CLINICAL DATA:  82 year old female with generalized weakness for 2 days. Initial encounter.  EXAM: CT HEAD WITHOUT CONTRAST TECHNIQUE: Contiguous axial images were obtained from the base of the skull through the vertex without intravenous contrast. COMPARISON:  08/13/2015. FINDINGS: No skull fracture or intracranial hemorrhage. Remote left thalamic infarct. Moderate chronic small vessel disease changes. No CT evidence of large acute infarct. Vascular calcifications. Mild global atrophy without hydrocephalus. Visualized orbits unremarkable. IMPRESSION: No intracranial hemorrhage or CT evidence of large acute infarct. Remote left thalamic infarct. Chronic small vessel disease changes. Mild global atrophy without hydrocephalus. Electronically Signed   By: Genia Del M.D.   On: 01/12/2016 13:03   Mr Brain Wo Contrast  01/12/2016  CLINICAL DATA:  Generalized weakness.  Left facial droop. EXAM: MRI HEAD WITHOUT CONTRAST TECHNIQUE: Multiplanar, multiecho pulse sequences of the brain and surrounding structures were obtained without intravenous contrast. COMPARISON:  CT head 01/12/2016.  MRI 08/13/2015 FINDINGS: Small patchy areas of restricted diffusion in the occipital pole bilaterally compatible with acute infarct. Multiple patchy areas of acute infarct in the frontal parietal cortex over the convexity bilaterally right greater than left. On the coronal diffusion imaging this pattern is in a watershed distribution. Small area of acute infarct in the left occipital parietal periventricular white matter. Moderate to advanced atrophy. Chronic ischemic changes in the white matter bilaterally. Small area of chronic micro hemorrhage in the frontal lobes bilaterally. Negative for mass.  No shift of the midline structures Paranasal sinuses clear.  Normal orbit.  Pituitary not enlarged. IMPRESSION: Multiple small patchy areas of acute infarct in the occipital pole bilaterally as well as in the convexity bilaterally right greater than left. Pattern is suggestive of global hypoperfusion. This may be due to hypotension  or watershed infarction. Cerebral emboli considered less likely. Electronically Signed   By: Franchot Gallo M.D.   On: 01/12/2016 16:20   Dg Chest Portable 1 View  01/12/2016  CLINICAL DATA:  82 year old female with weakness. Fell yesterday on buttocks. Denies injury. Initial encounter. EXAM: PORTABLE CHEST 1 VIEW COMPARISON:  12/23/2014. FINDINGS: No infiltrate, congestive heart failure or pneumothorax. Calcified markedly tortuous aorta. Aneurysm not excluded. Similar appearance to prior exam. Heart size within normal limits. Left shoulder degenerative changes. IMPRESSION: Clear lungs. Calcified markedly tortuous aorta. Aneurysm not excluded. Similar appearance to prior exam. Electronically Signed   By: Genia Del M.D.   On: 01/12/2016 11:35    Assessment: 82 y.o. female presenting with weakness and facial droop.  MRI of the brain personally reviewed and shows multiple small infarcts bilaterally.  Patient on ASA at home.  Last work up in November of last year. Repeat studies indicated.    Stroke Risk Factors -  hyperlipidemia  Plan: 1. HgbA1c, fasting lipid panel 2. PT consult, OT consult, Speech consult 3. Echocardiogram 4. Carotid dopplers 5. Prophylactic therapy-Antiplatelet med: Plavix - dose 75mg  daily 6. Telemetry monitoring 7. Frequent neuro checks 8. Would reinitiate statin therapy   Alexis Goodell, MD Neurology [phone] 01/13/2016, 10:13 AM

## 2016-01-13 NOTE — Progress Notes (Signed)
Initial Nutrition Assessment  DOCUMENTATION CODES:   Severe malnutrition in context of chronic illness, Underweight  INTERVENTION:   -Recommend Regular Diet order to optimize nutritional intake -Will send meats chopped if pt requests -Will send snack of cottage cheese and peaches at bedtime and Magic Cup as afternoon snack (supplement provides approximately 300kcals and 9g protein) -Recommend Ensure Enlive po TID, each supplement provides 350 kcal and 20 grams of protein - RD provided family with "High-Calorie High-Protein Nutrition Therapy" handout from the Academy of [employer]etics. Reviewed patient's dietary recall. Provided examples on ways to increase caloric density of foods and beverages frequently consumed by the patient. Also provided ideas to promote variety and to incorporate additional nutrient dense foods into patient's diet. Discussed eating small frequent meals and snacks to assist in increasing overall po intake. Teach back method used.   NUTRITION DIAGNOSIS:   Malnutrition related to chronic illness as evidenced by percent weight loss, severe depletion of muscle mass, severe depletion of body fat, energy intake < or equal to 75% for > or equal to 1 month.  GOAL:   Patient will meet greater than or equal to 90% of their needs  MONITOR:   PO intake, Supplement acceptance, Labs, Weight trends, I & O's  REASON FOR ASSESSMENT:   Malnutrition Screening Tool    ASSESSMENT:   Pt admitted with generalized weakness and left facial weakness in ED per MD note.   Past Medical History  Diagnosis Date  . Dementia   . TIA (transient ischemic attack)   . Cancer ([place])   . Stroke [facility])     Diet Order:  Diet regular Room service appropriate?: Yes; Fluid consistency:: Thin    Current Nutrition: Pt reports eating bites of yogurt this morning.   Food/Nutrition-Related History: Pt reports poor appetite for months. Pt family reports very poor po intake and weakness  for at least one month. Pt family describes pt eating 1/2 of ham biscuit for breakfast, Ensure for lunch and usually no dinner or bites of dinner at best. Pt reports no trouble chewing or swallowing at this time.   Scheduled Medications:  . calcium-vitamin D  1 tablet Oral Daily  . clopidogrel  75 mg Oral Daily  . docusate sodium  100 mg Oral BID  . donepezil  10 mg Oral QHS  . DULoxetine  60 mg Oral Daily  . enoxaparin (LOVENOX) injection  30 mg Subcutaneous Q24H  . feeding supplement (ENSURE ENLIVE)  237 mL Oral TID WC  . sodium chloride flush  3 mL Intravenous Q12H    Electrolyte/Renal Profile and Glucose Profile:   Recent Labs Lab 01/12/16 1051 01/13/16 0436  NA 137 136  K 3.9 3.5  CL 107 107  CO2 25 25  BUN 11 12  CREATININE 0.75 0.73  CALCIUM 10.0 9.6  GLUCOSE 116* 99   Protein Profile: No results for input(s): ALBUMIN in the last 168 hours.  Gastrointestinal Profile: Last BM:  01/12/2016   Nutrition-Focused Physical Exam Findings: Nutrition-Focused physical exam completed. Findings are mild-severe fat depletion, moderate-severe muscle depletion, and no edema.     Weight Change: Pt family reports weight of 98lbs 2 months ago and 108lbs 4 months ago (14% weight loss in 4 months)   Skin:  Reviewed, no issues   Height:   Ht Readings from Last 1 Encounters:  01/12/16 5\' 2"  (1.575 m)    Weight:   Wt Readings from Last 1 Encounters:  01/12/16 93 lb 1.6 oz (42.23 kg)  Ideal Body Weight:   50kg  BMI:  Body mass index is 17.02 kg/(m^2).  Estimated Nutritional Needs:   Kcal:  using IBW of 50kg, BEE: 903kcals, TEE: (IF 1.2-1.4)(AF 1.2) 1300-1517kcals  Protein:  50-60g protein (1.0-1.2g/kg)  Fluid:  1.5-1.7L fluid  EDUCATION NEEDS:   Education needs addressed   Dwyane Luo, RD, LDN Pager [phone] Weekend/On-Call Pager [phone]

## 2016-01-13 NOTE — Care Management (Signed)
Admitted to [facility] with the diagnosis of generalized weakness. Lives with husband, Carrolyn Meiers [phone]). Last seen Dr. Benita Stabile last month. No home health. No skilled facility. No home oxygen. Uses no aids for ambulation. Gave Med Alert information. States she takes care of all basic activities of daily living herself. States she continues to drive, but friend is sitting at the bedside shaking her head no. [place] 01/11/16. Decreased appetite for a long time. Family will transport. Shelbie Ammons RN MSN CCM Care Management [phone]

## 2016-01-13 NOTE — Evaluation (Signed)
Physical Therapy Evaluation Patient Details Name: Savannah Lee MRN: [id number] DOB: 1929/06/25 Today's Date: 01/13/2016   History of Present Illness  Pt is an 82 y.o. female presenting to hospital with unsteady gait, fall, weakness, L facial droop (now resolved per notes), and difficulty ambulating.  MRI shows multiple small acute infarcts B.  PMH includes dementia, CA, TIA, stroke 2016 (R UE and R LE weakness chronic).  Clinical Impression  Prior to admission, pt required assist for ambulation--usually holds onto husbands arm but occasionally will use RW (pt with stroke in November 2016 and pt's family reports that pt has had 3-5 falls since then and wish pt had gone to STR instead of discharging directly home at that time).  Pt lives with her husband on main level of home with stairs to enter.  Currently pt is min assist supine to sit; min to mod assist to stand with RW, and mod assist to take a few steps bed to recliner with RW.  Pt unable to find hand placement with L UE on walker (reaching into air trying to find walker on L side) and required hand over hand assist with this; pt demonstrating impaired coordination with L LE with ambulation and decreased stance time L LE with difficulty advancing/clearing R foot from floor to take a step.  Pt would benefit from skilled PT to address noted impairments and functional limitations.  Recommend pt discharge to STR when medically appropriate.     Follow Up Recommendations SNF    Equipment Recommendations       Recommendations for Other Services   OT consult (nursing notified and order was placed)    Precautions / Restrictions Precautions Precautions: Fall Restrictions Weight Bearing Restrictions: No      Mobility  Bed Mobility Overal bed mobility: Needs Assistance Bed Mobility: Supine to Sit     Supine to sit: Min assist     General bed mobility comments: assist for trunk and scooting to edge of bed; increased time and effort  required  Transfers Overall transfer level: Needs assistance Equipment used: Rolling walker (2 wheeled) Transfers: Sit to/from Stand Sit to Stand: Min assist;Mod assist         General transfer comment: vc's for technique and assist for L UE hand placement required on walker (pt unable to find hand hold for L UE on walker)  Ambulation/Gait Ambulation/Gait assistance: Mod assist Ambulation Distance (Feet): 3 Feet (bed to recliner) Assistive device: Rolling walker (2 wheeled)   Gait velocity: decreased   General Gait Details: decreased stance time L LE; difficulty advancing R LE/clearing R LE/foot from floor; ataxic movement with L LE  Stairs            Wheelchair Mobility    Modified Rankin (Stroke Patients Only)       Balance Overall balance assessment: Needs assistance Sitting-balance support: Bilateral upper extremity supported;Feet supported Sitting balance-Leahy Scale: Fair     Standing balance support: Bilateral upper extremity supported (on RW) Standing balance-Leahy Scale: Fair                               Pertinent Vitals/Pain Pain Assessment: No/denies pain  Vitals stable and WFL throughout treatment session.    Home Living Family/patient expects to be discharged to:: Skilled nursing facility Living Arrangements: Spouse/significant other Available Help at Discharge: Family Type of Home: House Home Access: Stairs to enter Entrance Stairs-Rails: Psychiatric nurse of Steps: 4-5 Home  Layout: Two level;Able to live on main level with bedroom/bathroom Home Equipment: Walker - 2 wheels      Prior Function Level of Independence: Needs assistance   Gait / Transfers Assistance Needed: pt occasionally uses RW but more often husband assists with UE/arm for support; family tries to assist pt at all times for functional mobility  ADL's / Homemaking Assistance Needed: Independent with ADL's  Comments: Pt's family reports 3-5  falls in past 6 months     Hand Dominance   Dominant Hand: Right    Extremity/Trunk Assessment   Upper Extremity Assessment: RUE deficits/detail;LUE deficits/detail (R UE strength (shoulder flexion, elbow flexion/extension at least 4/5);)       LUE Deficits / Details: L shoulder flexion AROM at least 60 degrees (pt's family reports h/o shoulder injury); elbow flexion/extension at least 4/5; L hand grip weaker than R hand grip   Lower Extremity Assessment: RLE deficits/detail;LLE deficits/detail RLE Deficits / Details: R hip flexion, knee flexion/extension, and DF at least 4/5 LLE Deficits / Details: L hip flexion, knee flexion/extension at least 4/5; L DF 3+/5   Decreased coordination L heel to shin. Normal B LE proprioception.  Pt denies any tingling/numbness B LE's.   Communication   Communication: HOH (has hearing aide but declined to use it during session.)  Cognition Arousal/Alertness: Awake/alert Behavior During Therapy: WFL for tasks assessed/performed Overall Cognitive Status: History of cognitive impairments - at baseline (Oriented to person, place, but not time)       Memory: Decreased recall of precautions              General Comments   Nursing cleared pt for participation in physical therapy.  Pt agreeable to PT session. Pt's daughter and husband present during session.    Exercises        Assessment/Plan    PT Assessment Patient needs continued PT services  PT Diagnosis Difficulty walking;Abnormality of gait   PT Problem List Decreased strength;Decreased activity tolerance;Decreased balance;Decreased mobility;Decreased coordination;Decreased cognition;Decreased knowledge of use of DME;Decreased safety awareness;Decreased knowledge of precautions  PT Treatment Interventions DME instruction;Gait training;Stair training;Functional mobility training;Therapeutic activities;Therapeutic exercise;Balance training;Neuromuscular re-education;Patient/family  education   PT Goals (Current goals can be found in the Care Plan section) Acute Rehab PT Goals Patient Stated Goal: to be able to walk again PT Goal Formulation: With patient/family Time For Goal Achievement: 01/27/16 Potential to Achieve Goals: Good    Frequency 7X/week   Barriers to discharge        Co-evaluation               End of Session Equipment Utilized During Treatment: Gait belt Activity Tolerance: Patient tolerated treatment well Patient left: in chair;with call bell/phone within reach;with chair alarm set;with family/visitor present Nurse Communication: Mobility status;Precautions         Time: [id number] PT Time Calculation (min) (ACUTE ONLY): 33 min   Charges:   PT Evaluation $PT Eval Moderate Complexity: 1 Procedure PT Treatments $Therapeutic Activity: 8-22 mins   PT G CodesLeitha Bleak 2016/01/25, 3:38 PM Leitha Bleak, [place]

## 2016-01-13 NOTE — Progress Notes (Signed)
[facility] [place] NAME: Savannah Lee    MR#:  [id number]  DATE OF BIRTH:  09-17-1929  SUBJECTIVE:  CHIEF COMPLAINT:   Chief Complaint  Patient presents with  . Weakness  sitting on potty having BM, husband at bedside. No new issues.  REVIEW OF SYSTEMS:  Review of Systems  Constitutional: Negative for fever, weight loss, malaise/fatigue and diaphoresis.  HENT: Negative for ear discharge, ear pain, hearing loss, nosebleeds, sore throat and tinnitus.   Eyes: Negative for blurred vision and pain.  Respiratory: Negative for cough, hemoptysis, shortness of breath and wheezing.   Cardiovascular: Negative for chest pain, palpitations, orthopnea and leg swelling.  Gastrointestinal: Negative for heartburn, nausea, vomiting, abdominal pain, diarrhea, constipation and blood in stool.  Genitourinary: Negative for dysuria, urgency and frequency.  Musculoskeletal: Negative for myalgias and back pain.  Skin: Negative for itching and rash.  Neurological: Positive for weakness. Negative for dizziness, tingling, tremors, focal weakness, seizures and headaches.  Psychiatric/Behavioral: Negative for depression. The patient is not nervous/anxious.     DRUG ALLERGIES:  No Known Allergies VITALS:  Blood pressure 155/74, pulse 76, temperature 98.5 F (36.9 C), temperature source Oral, resp. rate 18, height 5\' 2"  (1.575 m), weight 42.23 kg (93 lb 1.6 oz), SpO2 95 %. PHYSICAL EXAMINATION:  Physical Exam  Constitutional: She is oriented to person, place, and time and well-developed, well-nourished, and in no distress.  HENT:  Head: Normocephalic and atraumatic.  Eyes: Conjunctivae and EOM are normal. Pupils are equal, round, and reactive to light.  Neck: Normal range of motion. Neck supple. No tracheal deviation present. No thyromegaly present.  Cardiovascular: Normal rate, regular rhythm and normal heart sounds.   Pulmonary/Chest: Effort normal and  breath sounds normal. No respiratory distress. She has no wheezes. She exhibits no tenderness.  Abdominal: Soft. Bowel sounds are normal. She exhibits no distension. There is no tenderness.  Musculoskeletal: Normal range of motion.  Neurological: She is alert and oriented to person, place, and time. No cranial nerve deficit.  Skin: Skin is warm and dry. No rash noted.  Psychiatric: Mood and affect normal.   LABORATORY PANEL:   CBC  Recent Labs Lab 01/13/16 0436  WBC 7.5  HGB 11.5*  HCT 33.5*  PLT 87*   ------------------------------------------------------------------------------------------------------------------ Chemistries   Recent Labs Lab 01/13/16 0436  NA 136  K 3.5  CL 107  CO2 25  GLUCOSE 99  BUN 12  CREATININE 0.73  CALCIUM 9.6   RADIOLOGY:  No results found. ASSESSMENT AND PLAN:   * Acute CVA: acute infarct in the occipital pole bilaterally seen on MRI. Asa + statin. PT, OT, ST c/s - likely STR/SNF - Facial weakness improving  * Thrombocytopenia ([place]): Monitor, platelets 87  * rash of unclear etiology, supportive therapy       All the records are reviewed and case discussed with Care Management/Social Worker. Management plans discussed with the patient, family and they are in agreement.  CODE STATUS: DNR  TOTAL TIME TAKING CARE OF THIS PATIENT: 35 minutes.   More than 50% of the time was spent in counseling/coordination of care: YES  POSSIBLE D/C IN 2-3 DAYS, DEPENDING ON CLINICAL CONDITION.   [facility], Maddax Palinkas M.D on 01/13/2016 at 5:22 PM  Between 7am to 6pm - Pager - [phone]  After 6pm go to [email or web address] - password [email or web address] [facility]  Office  [phone]  CC: Primary care physician; Albina Billet, MD  Note: This  dictation was prepared with Dragon dictation along with smaller phrase technology. Any transcriptional errors that result from this process are unintentional.

## 2016-01-13 NOTE — Consult Note (Signed)
PHARMACIST - PHYSICIAN COMMUNICATION  CONCERNING:  Enoxaparin (Lovenox) for DVT Prophylaxis    RECOMMENDATION: Patient was prescribed enoxaprin 40mg  q24 hours for VTE prophylaxis.  Patient is a female weighing 42kg. Based on [place] patient is candidate for enoxaparin 30mg  q24 hour dosing due to weight <45kg.  DESCRIPTION: Pharmacy has adjusted enoxaparin dose and is now receiving enoxaparin 30mg  q24 hours.   Nancy Fetter, PharmD Pharmacy Resident 01/13/2016 9:16 AM

## 2016-01-14 ENCOUNTER — Inpatient Hospital Stay (HOSPITAL_COMMUNITY)
Admit: 2016-01-14 | Discharge: 2016-01-14 | Disposition: A | Discharge status: Home / Self Care | Payer: Medicare Other | Attending: Neurology | Admitting: Neurology

## 2016-01-14 DIAGNOSIS — I6350 Cerebral infarction due to unspecified occlusion or stenosis of unspecified cerebral artery: Secondary | ICD-10-CM

## 2016-01-14 LAB — LIPID PANEL
Cholesterol: 113 mg/dL (ref 0–200)
HDL: 75 mg/dL (ref >40)
LDL CALC: 26 mg/dL (ref 0–99)
Total CHOL/HDL Ratio: 1.5 RATIO
Triglycerides: 59 mg/dL (ref <150)
VLDL: 12 mg/dL (ref 0–40)

## 2016-01-14 LAB — ECHOCARDIOGRAM COMPLETE
HEIGHTINCHES: 62 in
Weight: 1489.6 oz

## 2016-01-14 NOTE — Evaluation (Signed)
Occupational Therapy Evaluation Patient Details Name: Savannah Lee MRN: [id number] DOB: 02-20-1929 Today's Date: 01/14/2016    History of Present Illness Pt is an 82 y.o. female presenting to hospital with unsteady gait, fall, weakness, L facial droop (now resolved per notes), and difficulty ambulating.  MRI shows multiple small acute infarcts B.  PMH includes dementia, CA, TIA, stroke 2016 (R UE and R LE weakness chronic).   Clinical Impression   Pt. Is an 82 y.o. Female who was admitted to [facility] with generalized weakness. Pt. Presents with impaired LUE functioning, and overall weakness which hinders her ability to complete ADL and IADL functioning. Pt. Could benefit from continued skilled OT intervention to improve ADL functioning, and return to her PLOF.     Follow Up Recommendations  SNF    Equipment Recommendations  3 in 1 bedside comode    Recommendations for Other Services PT consult     Precautions / Restrictions Precautions Precautions: Fall Restrictions Weight Bearing Restrictions: No      Mobility Bed Mobility Overal bed mobility: Needs Assistance Bed Mobility: Supine to Sit     Supine to sit: Min assist        Transfers Overall transfer level: Needs assistance Equipment used: Rolling walker (2 wheeled) Transfers: Sit to/from Stand Sit to Stand: Mod assist         General transfer comment: With cues for left hand placement.    Balance Overall balance assessment: Needs assistance   Sitting balance-Leahy Scale: Fair       Standing balance-Leahy Scale: Fair                              ADL Overall ADL's : Needs assistance/impaired Eating/Feeding: Set up   Grooming: Minimal assistance           Upper Body Dressing : Maximal assistance   Lower Body Dressing: Maximal assistance     Toilet Transfer Details (indicate cue type and reason): [place] transfers in preparation for toileting.                 Vision      Perception     Praxis      Pertinent Vitals/Pain Pain Assessment: 0-10 Pain Score: 2  Pain Location: LEs     Hand Dominance Right   Extremity/Trunk Assessment Upper Extremity Assessment RUE Deficits / Details: RUE strength over all 4-/5 LUE Deficits / Details: Limited LUE AROM/PROM from an old injury. Left elbow flexion/extension 3/5. Impaired functional left grip strength and coordination.           Communication     Cognition                           General Comments       Exercises       Shoulder Instructions      Home Living Family/patient expects to be discharged to:: Skilled nursing facility Living Arrangements: Spouse/significant other Available Help at Discharge: Family Type of Home: House Home Access: Stairs to enter [employer] of Steps: 4-5 Entrance Stairs-Rails: Right;Left Home Layout: Two level;Able to live on main level with bedroom/bathroom Alternate Level Stairs-Number of Steps: 12 Alternate Level Stairs-Rails: Right           Home Equipment: Walker - 2 wheels          Prior Functioning/Environment Level of Independence: Needs assistance    ADL's /  Homemaking Assistance Needed: Independent with ADL's (Assist with IADL tasks)   Comments: Pt's family reports 3-5 falls in past 6 months    OT Diagnosis: Generalized weakness   OT Problem List: Decreased strength;Decreased activity tolerance;Impaired tone;Decreased knowledge of use of DME or AE;Impaired UE functional use;Impaired balance (sitting and/or standing)   OT Treatment/Interventions:      OT Goals(Current goals can be found in the care plan section) Acute Rehab OT Goals Patient Stated Goal: To be able to do the things she was able to do before OT Goal Formulation: With patient Time For Goal Achievement: 01/28/16 Potential to Achieve Goals: Fair  OT Frequency: Min 2X/week   Barriers to D/C:            Co-evaluation              End of  Session Equipment Utilized During Treatment: Rolling walker  Activity Tolerance: Patient tolerated treatment well Patient left: in bed;with call bell/phone within reach;with bed alarm set;with family/visitor present   Time: 1045-1120 OT Time Calculation (min): 35 min Charges:  OT General Charges $OT Visit: 1 Procedure OT Evaluation $OT Eval Moderate Complexity: 1 Procedure OT Treatments $Self Care/Home Management : 8-22 mins G-Codes:    Harrel Carina, MS, OTR/L Harrel Carina 01/14/2016, 11:52 AM

## 2016-01-14 NOTE — NC FL2 (Signed)
[place] LEVEL OF CARE SCREENING TOOL     IDENTIFICATION  Patient Name: Savannah Lee Birthdate: [birth year] Sex: female Admission Date (Current Location): 01/12/2016  [place] and [place] Number:  Engineering geologist and Address:  [facility], [address], [place], [place] [address]      Provider Number: [id number]  Attending Physician Name and Address:  Max Sane, MD  Relative Name and Phone Number:       Current Level of Care: Hospital Recommended Level of Care: [place] Prior Approval Number:    Date Approved/Denied:   PASRR Number:    Discharge Plan: SNF    Current Diagnoses: Patient Active Problem List   Diagnosis Date Noted  . Protein-calorie malnutrition, severe 01/13/2016  . Generalized weakness 01/12/2016  . Rash 01/12/2016  . Thrombocytopenia ([place]) 01/12/2016  . Facial weakness 01/12/2016  . CVA (cerebral infarction) 08/12/2015    Orientation RESPIRATION BLADDER Height & Weight     Self, Time, Situation, Place  Normal Continent Weight: 93 lb 1.6 oz (42.23 kg) Height:  5\' 2"  (157.5 cm)  BEHAVIORAL SYMPTOMS/MOOD NEUROLOGICAL BOWEL NUTRITION STATUS      Continent Diet (Regular)  AMBULATORY STATUS COMMUNICATION OF NEEDS Skin   Limited Assist   Normal                       Personal Care Assistance Level of Assistance  Bathing, Feeding, Dressing Bathing Assistance: Limited assistance Feeding assistance: Independent Dressing Assistance: Limited assistance     Functional Limitations Info  Sight, Hearing, Speech Sight Info: Adequate Hearing Info: Adequate Speech Info: Adequate    SPECIAL CARE FACTORS FREQUENCY  PT (By licensed PT)     PT Frequency: 5              Contractures      Additional Factors Info  Code Status, Allergies Code Status Info: DNR Allergies Info: No known allergies           Current Medications (01/14/2016):  This is the current hospital active  medication list Current Facility-Administered Medications  Medication Dose Route Frequency Provider Last Rate Last Dose  . acetaminophen (TYLENOL) tablet 650 mg  650 mg Oral Q6H PRN Theodoro Grist, MD       Or  . acetaminophen (TYLENOL) suppository 650 mg  650 mg Rectal Q6H PRN Theodoro Grist, MD      . atorvastatin (LIPITOR) tablet 40 mg  40 mg Oral q1800 Max Sane, MD   40 mg at 01/14/16 1647  . calcium-vitamin D (OSCAL WITH D) 500-200 MG-UNIT per tablet 1 tablet  1 tablet Oral Daily Theodoro Grist, MD   1 tablet at 01/14/16 1110  . clopidogrel (PLAVIX) tablet 75 mg  75 mg Oral Daily Alexis Goodell, MD   75 mg at 01/14/16 1110  . diazepam (VALIUM) tablet 5 mg  5 mg Oral QHS PRN Theodoro Grist, MD      . docusate sodium (COLACE) capsule 100 mg  100 mg Oral BID Theodoro Grist, MD   100 mg at 01/14/16 1110  . donepezil (ARICEPT) tablet 10 mg  10 mg Oral QHS Theodoro Grist, MD   10 mg at 01/13/16 2015  . DULoxetine (CYMBALTA) DR capsule 60 mg  60 mg Oral Daily Theodoro Grist, MD   60 mg at 01/14/16 1110  . enoxaparin (LOVENOX) injection 30 mg  30 mg Subcutaneous Q24H Sheema M Hallaji, RPH   30 mg at 01/14/16 1647  .  feeding supplement (ENSURE ENLIVE) (ENSURE ENLIVE) liquid 237 mL  237 mL Oral TID WC Vipul Shah, MD   237 mL at 01/14/16 1200  . ondansetron (ZOFRAN) tablet 4 mg  4 mg Oral Q6H PRN Theodoro Grist, MD       Or  . ondansetron (ZOFRAN) injection 4 mg  4 mg Intravenous Q6H PRN Theodoro Grist, MD   4 mg at 01/12/16 2001  . sodium chloride flush (NS) 0.9 % injection 3 mL  3 mL Intravenous Q12H Theodoro Grist, MD   3 mL at 01/14/16 1113     Discharge Medications: Please see discharge summary for a list of discharge medications.  Relevant Imaging Results:  Relevant Lab Results:   Additional Information SSN:  [id number]  Darden Dates, LCSW

## 2016-01-14 NOTE — Progress Notes (Signed)
[place] [facility] [place] NAME: Savannah Lee    MR#:  [id number]  DATE OF BIRTH:  05-Aug-1929  SUBJECTIVE:  CHIEF COMPLAINT:   Chief Complaint  Patient presents with  . Weakness  feels better. Waiting for placement  REVIEW OF SYSTEMS:  Review of Systems  Constitutional: Negative for fever, weight loss, malaise/fatigue and diaphoresis.  HENT: Negative for ear discharge, ear pain, hearing loss, nosebleeds, sore throat and tinnitus.   Eyes: Negative for blurred vision and pain.  Respiratory: Negative for cough, hemoptysis, shortness of breath and wheezing.   Cardiovascular: Negative for chest pain, palpitations, orthopnea and leg swelling.  Gastrointestinal: Negative for heartburn, nausea, vomiting, abdominal pain, diarrhea, constipation and blood in stool.  Genitourinary: Negative for dysuria, urgency and frequency.  Musculoskeletal: Negative for myalgias and back pain.  Skin: Negative for itching and rash.  Neurological: Positive for weakness. Negative for dizziness, tingling, tremors, focal weakness, seizures and headaches.  Psychiatric/Behavioral: Negative for depression. The patient is not nervous/anxious.     DRUG ALLERGIES:  No Known Allergies VITALS:  Blood pressure 134/74, pulse 95, temperature 98.1 F (36.7 C), temperature source Oral, resp. rate 18, height 5\' 2"  (1.575 m), weight 42.23 kg (93 lb 1.6 oz), SpO2 92 %. PHYSICAL EXAMINATION:  Physical Exam  Constitutional: She is oriented to person, place, and time and well-developed, well-nourished, and in no distress.  HENT:  Head: Normocephalic and atraumatic.  Eyes: Conjunctivae and EOM are normal. Pupils are equal, round, and reactive to light.  Neck: Normal range of motion. Neck supple. No tracheal deviation present. No thyromegaly present.  Cardiovascular: Normal rate, regular rhythm and normal heart sounds.   Pulmonary/Chest: Effort normal and breath sounds normal. No  respiratory distress. She has no wheezes. She exhibits no tenderness.  Abdominal: Soft. Bowel sounds are normal. She exhibits no distension. There is no tenderness.  Musculoskeletal: Normal range of motion.  Neurological: She is alert and oriented to person, place, and time. No cranial nerve deficit.  Skin: Skin is warm and dry. No rash noted.  Psychiatric: Mood and affect normal.   LABORATORY PANEL:   CBC  Recent Labs Lab 01/13/16 0436  WBC 7.5  HGB 11.5*  HCT 33.5*  PLT 87*   ------------------------------------------------------------------------------------------------------------------ Chemistries   Recent Labs Lab 01/13/16 0436  NA 136  K 3.5  CL 107  CO2 25  GLUCOSE 99  BUN 12  CREATININE 0.73  CALCIUM 9.6   RADIOLOGY:  No results found. ASSESSMENT AND PLAN:   * Acute CVA: acute infarct in the occipital pole bilaterally seen on MRI. Asa + statin. PT, OT, ST c/s - likely STR/SNF - Facial weakness improving  * Thrombocytopenia ([place]): Monitor,   * rash of unclear etiology, supportive therapy       All the records are reviewed and case discussed with Care Management/Social Worker. Management plans discussed with the patient, family and they are in agreement.  CODE STATUS: DNR  TOTAL TIME TAKING CARE OF THIS PATIENT: 25 minutes.   More than 50% of the time was spent in counseling/coordination of care: YES  POSSIBLE D/C IN 1-2 DAYS, DEPENDING ON CLINICAL CONDITION.likely tomorrow based on placement   [facility], Dearra Myhand M.D on 01/14/2016 at 5:10 PM  Between 7am to 6pm - Pager - [phone]  After 6pm go to [email or web address] - password [email or web address] [facility] [facility]  Office  [phone]  CC: Primary care physician; Albina Billet, MD  Note: This dictation was prepared  with Dragon dictation along with smaller phrase technology. Any transcriptional errors that result from this process are unintentional.

## 2016-01-14 NOTE — Clinical Documentation Improvement (Signed)
Internal Medicine  Possible Conditions?        Malnutrition  Document Severity - Severe(third degree), Moderate (second degree), Mild (first degree)  Other condition  Unable to clinically determine  Document any associated diagnoses/conditions Please update your documentation within the medical record to reflect your response to this query. Thank you.  Supporting Information: (as per RD assessment) Severe malnutrition in context of chronic illness, Underweight  INTERVENTION:  -Recommend Regular Diet order to optimize nutritional intake  -Will send meats chopped if pt requests  -Will send snack of cottage cheese and peaches at bedtime and Magic Cup as afternoon snack (supplement provides approximately 300kcals and 9g protein)  -Recommend Ensure Enlive po TID, each supplement provides 350 kcal and 20 grams of protein  - RD provided family with "High-Calorie High-Protein Nutrition Therapy" handout from the Academy of Nutrition and Dietetics. Reviewed patient's dietary recall. Provided examples on ways to increase caloric density of foods and beverages frequently consumed by the patient. Also provided ideas to promote variety and to incorporate additional nutrient dense foods into patient's diet. Discussed eating small frequent meals and snacks to assist in increasing overall po intake. Teach back method used.  NUTRITION DIAGNOSIS:  Malnutrition related to chronic illness as evidenced by percent weight loss, severe depletion of muscle mass, severe depletion of body fat, energy intake < or equal to 75% for > or equal to 1 month.   Please exercise your independent, professional judgment when responding. A specific answer is not anticipated or expected.  Thank You, Alessandra Grout, RN, BSN, CCDS,Clinical Documentation Specialist:  [phone]  [phone]=[employer] [place]- Health Information Management

## 2016-01-14 NOTE — Progress Notes (Signed)
Physical Therapy Treatment Patient Details Name: Savannah Lee MRN: [id number] DOB: [birth year] Today's Date: 01/14/2016    History of Present Illness Pt is an 82 y.o. female presenting to hospital with unsteady gait, fall, weakness, L facial droop (now resolved per notes), and difficulty ambulating.  MRI shows multiple small acute infarcts B.  PMH includes dementia, CA, TIA, stroke 2016 (R UE and R LE weakness chronic).    PT Comments    Pt was min assist with bed mobility and mod assist for two sets of sit to stand with RW.  Pt reported she was more tired today than yesterday and that she did not want to walk due to fatigue.  Pt did not ambulate compared to last session.  Next session PT will attempt ambulation.     Follow Up Recommendations  SNF     Equipment Recommendations       Recommendations for Other Services       Precautions / Restrictions Precautions Precautions: Fall Restrictions Weight Bearing Restrictions: No    Mobility  Bed Mobility Overal bed mobility: Needs Assistance Bed Mobility: Supine to Sit;Rolling;Sit to Sidelying Rolling: Min assist   Supine to sit: Min assist   Sit to sidelying: Min assist General bed mobility comments: assist for trunk and scooting to edge of bed; increased time and effort required  Transfers Overall transfer level: Needs assistance Equipment used: Rolling walker (2 wheeled) Transfers: Sit to/from Stand (2 sets ) Sit to Stand: Mod assist         General transfer comment: With hand over hand cues for left hand placement on RW.  Ambulation/Gait             General Gait Details: not performed due to fatigue    Stairs            Wheelchair Mobility    Modified Rankin (Stroke Patients Only)       Balance Overall balance assessment: Needs assistance Sitting-balance support: Bilateral upper extremity supported (bed rail ) Sitting balance-Leahy Scale: Fair     Standing balance support: Bilateral upper  extremity supported (RW ) Standing balance-Leahy Scale: Fair                      Cognition Arousal/Alertness: Awake/alert Behavior During Therapy: WFL for tasks assessed/performed Overall Cognitive Status: History of cognitive impairments - at baseline       Memory: Decreased recall of precautions      Pt had difficulty managing items on L side.        Exercises      General Comments   Nursing was contacted and cleared pt for physical therapy.  Pt was agreeable and session was modified due to fatigue.  Pt's family was present during the entire session.        Pertinent Vitals/Pain Pain Assessment: 0-10 Pain Score: 2  Pain Location: R knee  Pain Descriptors / Indicators: Aching;Constant;Discomfort Pain Intervention(s): Limited activity within patient's tolerance;Monitored during session;Repositioned  See flow sheet for vitals.     Home Living                      Prior Function            PT Goals (current goals can now be found in the care plan section) Acute Rehab PT Goals Patient Stated Goal: To be able to do the things she was able to do before PT Goal Formulation: With patient/family Time For Goal  Achievement: 01/27/16 Potential to Achieve Goals: Good Progress towards PT goals: Progressing toward goals    Frequency  7X/week    PT Plan      Co-evaluation             End of Session Equipment Utilized During Treatment: Gait belt Activity Tolerance: Patient limited by fatigue Patient left: in bed;with call bell/phone within reach;with bed alarm set;with family/visitor present     Time: [id number] PT Time Calculation (min) (ACUTE ONLY): 24 min  Charges:                       G Codes:      Mittie Bodo, SPT  Mittie Bodo 01/14/2016, 4:28 PM

## 2016-01-15 NOTE — Care Management Important Message (Signed)
Important Message  Patient Details  Name: EMIE POTEAT MRN: [id number] Date of Birth: 07-04-1929   Medicare Important Message Given:  Yes    Juliann Pulse A Tishina Lown 01/15/2016, 1:44 PM

## 2016-01-15 NOTE — Progress Notes (Signed)
Physical Therapy Treatment Patient Details Name: KAYLIE MARULANDA MRN: [id number] DOB: [birth year] Today's Date: 01/15/2016    History of Present Illness Pt is an 82 y.o. female presenting to hospital with unsteady gait, fall, weakness, L facial droop (now resolved per notes), and difficulty ambulating.  MRI shows multiple small acute infarcts B.  PMH includes dementia, CA, TIA, stroke 2016 (R UE and R LE weakness chronic).    PT Comments    Pt's family stated that she was less oriented compared to yesterday.  Pt did not know the year nor the month, when yesterday she did.  Pt was CGA for bed mobility and sit to stand with RW (2 sets).  Pt performed dynamic reaching tasks with L hand during sitting EOB for 5 minutes.  Pt was min assist with RW for ambulation for 3 feet.  Pt displayed increased mobility compared to last session.  Next session PT will attempt increased ambulation distance.   Follow Up Recommendations  SNF     Equipment Recommendations       Recommendations for Other Services       Precautions / Restrictions Precautions Precautions: Fall Restrictions Weight Bearing Restrictions: No    Mobility  Bed Mobility Overal bed mobility: Needs Assistance Bed Mobility: Supine to Sit;Rolling Rolling: Min guard   Supine to sit: Min guard     General bed mobility comments: increased time   Transfers Overall transfer level: Needs assistance Equipment used: Rolling walker (2 wheeled) Transfers: Sit to/from Stand (2 sets) Sit to Stand: Min guard         General transfer comment: With hand over hand cues for left hand placement.  Ambulation/Gait Ambulation/Gait assistance: Min assist Ambulation Distance (Feet): 3 Feet Assistive device: Rolling walker (2 wheeled) Gait Pattern/deviations: Step-to pattern;Decreased step length - left;Decreased stance time - left Gait velocity: decreased       Stairs            Wheelchair Mobility    Modified Rankin (Stroke  Patients Only)       Balance Overall balance assessment: Needs assistance Sitting-balance support: Feet supported Sitting balance-Leahy Scale: Good     Standing balance support: Bilateral upper extremity supported (RW) Standing balance-Leahy Scale: Fair                      Cognition Arousal/Alertness: Awake/alert Behavior During Therapy: WFL for tasks assessed/performed Overall Cognitive Status: Impaired/Different from baseline Area of Impairment: Orientation;Memory Orientation Level: Time;Situation   Memory: Decreased recall of precautions;Decreased short-term memory (Oriented to person and birthdate.  Did not know year, president or month)              Exercises      General Comments   Nursing was contacted and cleared pt for physical therapy.  Pt was agreeable and session was modified due to fatigue.  Pt's family was present during the entire session.  Nursing was present during the middle of the session.         Pertinent Vitals/Pain Pain Assessment: No/denies pain  See flow sheet for vitals.     Home Living                      Prior Function            PT Goals (current goals can now be found in the care plan section) Acute Rehab PT Goals Patient Stated Goal: To be able to do the things she was able  to do before PT Goal Formulation: With patient/family Time For Goal Achievement: 01/27/16 Potential to Achieve Goals: Good Progress towards PT goals: Progressing toward goals    Frequency  7X/week    PT Plan      Co-evaluation             End of Session Equipment Utilized During Treatment: Gait belt Activity Tolerance: Patient limited by fatigue Patient left: in chair;with call bell/phone within reach;with chair alarm set;with family/visitor present     Time: [id number] PT Time Calculation (min) (ACUTE ONLY): 29 min  Charges:                       G Codes:       Mittie Bodo, SPT  Mittie Bodo 01/15/2016, 11:45  AM

## 2016-01-15 NOTE — Clinical Social Work Note (Deleted)
CSW met with pt and family to address further questions about placement for STR at SNF. Pt's preference is [facility]. CSW will continue to follow.   Darden Dates, MSW, LCSW Clinical Social Worker  [phone]

## 2016-01-15 NOTE — Progress Notes (Signed)
[place] at [place] NAME: Savannah Lee    MR#:  [id number]  DATE OF BIRTH:  03/17/1929  SUBJECTIVE:  CHIEF COMPLAINT:   Chief Complaint  Patient presents with  . Weakness  same. Need 1 more night stay for placement  REVIEW OF SYSTEMS:  Review of Systems  Constitutional: Negative for fever, weight loss, malaise/fatigue and diaphoresis.  HENT: Negative for ear discharge, ear pain, hearing loss, nosebleeds, sore throat and tinnitus.   Eyes: Negative for blurred vision and pain.  Respiratory: Negative for cough, hemoptysis, shortness of breath and wheezing.   Cardiovascular: Negative for chest pain, palpitations, orthopnea and leg swelling.  Gastrointestinal: Negative for heartburn, nausea, vomiting, abdominal pain, diarrhea, constipation and blood in stool.  Genitourinary: Negative for dysuria, urgency and frequency.  Musculoskeletal: Negative for myalgias and back pain.  Skin: Negative for itching and rash.  Neurological: Positive for weakness. Negative for dizziness, tingling, tremors, focal weakness, seizures and headaches.  Psychiatric/Behavioral: Negative for depression. The patient is not nervous/anxious.     DRUG ALLERGIES:  No Known Allergies VITALS:  Blood pressure 131/71, pulse 68, temperature 98.4 F (36.9 C), temperature source Oral, resp. rate 18, height 5\' 2"  (1.575 m), weight 42.23 kg (93 lb 1.6 oz), SpO2 94 %. PHYSICAL EXAMINATION:  Physical Exam  Constitutional: She is oriented to person, place, and time and well-developed, well-nourished, and in no distress.  HENT:  Head: Normocephalic and atraumatic.  Eyes: Conjunctivae and EOM are normal. Pupils are equal, round, and reactive to light.  Neck: Normal range of motion. Neck supple. No tracheal deviation present. No thyromegaly present.  Cardiovascular: Normal rate, regular rhythm and normal heart sounds.   Pulmonary/Chest: Effort normal and breath sounds normal. No  respiratory distress. She has no wheezes. She exhibits no tenderness.  Abdominal: Soft. Bowel sounds are normal. She exhibits no distension. There is no tenderness.  Musculoskeletal: Normal range of motion.  Neurological: She is alert and oriented to person, place, and time. No cranial nerve deficit.  Skin: Skin is warm and dry. No rash noted.  Psychiatric: Mood and affect normal.   LABORATORY PANEL:   CBC  Recent Labs Lab 01/13/16 0436  WBC 7.5  HGB 11.5*  HCT 33.5*  PLT 87*   ------------------------------------------------------------------------------------------------------------------ Chemistries   Recent Labs Lab 01/13/16 0436  NA 136  K 3.5  CL 107  CO2 25  GLUCOSE 99  BUN 12  CREATININE 0.73  CALCIUM 9.6   RADIOLOGY:  No results found. ASSESSMENT AND PLAN:   * Acute CVA: acute infarct in the occipital pole bilaterally seen on MRI. Asa + statin. PT, OT, ST c/s - likely STR/SNF, needs 1 more night stay for placement - Facial weakness improving  * Thrombocytopenia ([place]): Monitor,   * rash of unclear etiology, supportive therapy       All the records are reviewed and case discussed with Care Management/Social Worker. Management plans discussed with the patient, family and they are in agreement.  CODE STATUS: DNR  TOTAL TIME TAKING CARE OF THIS PATIENT: 25 minutes.   More than 50% of the time was spent in counseling/coordination of care: YES  POSSIBLE D/C IN AM, DEPENDING ON CLINICAL CONDITION.likely tomorrow based on placement   [facility], Deval Mroczka M.D on 01/15/2016 at 7:52 AM  Between 7am to 6pm - Pager - [phone]  After 6pm go to [email or web address] - password [email or web address] [facility]  [facility]  Office  [phone]  CC: Primary care physician;  Albina Billet, MD  Note: This dictation was prepared with Dragon dictation along with smaller phrase technology. Any transcriptional errors that result from this process are unintentional.

## 2016-01-15 NOTE — Clinical Social Work Note (Signed)
CSW provided bed offers, and pt and family chose [facility]. CSW updated the facility. Pt likely be ready for discharge tomorrow. CSW will continue to follow.   Darden Dates, MSW, LCSW  Clinical Social Worker  [phone]

## 2016-01-15 NOTE — Clinical Social Work Note (Signed)
CSW met with pt and family to address further questions about placement for STR at SNF. Pt's preference is [facility]. CSW will continue to follow.   Darden Dates, MSW, LCSW Clinical Social Worker  [phone]

## 2016-01-15 NOTE — Clinical Social Work Placement (Signed)
   CLINICAL SOCIAL WORK PLACEMENT  NOTE  Date:  01/15/2016  Patient Details  Name: Savannah Lee MRN: [id number] Date of Birth: September 03, 1929  Clinical Social Work is seeking post-discharge placement for this patient at the [place] level of care (*CSW will initial, date and re-position this form in  chart as items are completed):  Yes   Patient/family provided with [facility] Work Department's list of facilities offering this level of care within the geographic area requested by the patient (or if unable, by the patient's family).  Yes   Patient/family informed of their freedom to choose among providers that offer the needed level of care, that participate in Medicare, Medicaid or managed care program needed by the patient, have an available bed and are willing to accept the patient.  Yes   Patient/family informed of [facility]'s ownership interest in [facility] and [facility], as well as of the fact that they are under no obligation to receive care at these facilities.  PASRR submitted to EDS on 01/15/16     PASRR number received on 01/15/16     Existing PASRR number confirmed on       FL2 transmitted to all facilities in geographic area requested by pt/family on 01/14/16     FL2 transmitted to all facilities within larger geographic area on       Patient informed that his/her managed care company has contracts with or will negotiate with certain facilities, including the following:        Yes   Patient/family informed of bed offers received.  Patient chooses bed at [facility]     Physician recommends and patient chooses bed at  [facility])    Patient to be transferred to   on  .  Patient to be transferred to facility by       Patient family notified on   of transfer.  Name of family member notified:        PHYSICIAN       Additional Comment:    _______________________________________________ Darden Dates, LCSW 01/15/2016, 3:14  PM

## 2016-01-16 ENCOUNTER — Encounter
Admission: RE | Admit: 2016-01-16 | Discharge: 2016-01-16 | Disposition: A | Discharge status: Home / Self Care | Payer: Medicare Other | Source: Ambulatory Visit | Attending: Internal Medicine | Admitting: Internal Medicine

## 2016-01-16 MED ORDER — CLOPIDOGREL BISULFATE 75 MG PO TABS: 75.0000 mg | ORAL_TABLET | Freq: Every day | ORAL | Status: AC

## 2016-01-16 NOTE — Progress Notes (Signed)
Pt alert. No resp distress. Skin wm and dry . No skin issues.  tol meds well.pt to be discharged to [facility] via ems.sl d/cd.  Report called to  Nurse at [facility]. Myer Haff. Ems called at this time. Pt to go to room 215 b. Pt changed to discharge transport pack.

## 2016-01-16 NOTE — Discharge Summary (Signed)
[place] at [place] NAME: Savannah Lee    MR#:  [id number]  DATE OF BIRTH:  14-Nov-1928  DATE OF ADMISSION:  01/12/2016 ADMITTING PHYSICIAN: Theodoro Grist, MD  DATE OF DISCHARGE: 01/16/2016  PRIMARY CARE PHYSICIAN: Albina Billet, MD    ADMISSION DIAGNOSIS:  Facial weakness [R29.810] Facial droop [R29.810] Inability to walk [R26.2] Generalized weakness [R53.1]  DISCHARGE DIAGNOSIS:  Principal Problem:   Generalized weakness Active Problems:   CVA (cerebral infarction)   Rash   Thrombocytopenia (HCC)   Facial weakness   Protein-calorie malnutrition, severe  SECONDARY DIAGNOSIS:   Past Medical History  Diagnosis Date  . Dementia   . TIA (transient ischemic attack)   . Cancer ([place])   . Stroke [facility])    HOSPITAL COURSE:  82 y.o. female with a known history of Dementia, TIA, stroke, which left patient with right upper and right lower extremity weakness, chronic admitted for weakness, unsteady gait, fall which is likely due to Acute CVA: acute infarct in the occipital pole bilaterally seen on MRI.   Will be D/C on Asa + Plavix + statin.  PT recommended STR/SNF where she is being D/Ced. Patient and family agreeable for same.  DISCHARGE CONDITIONS:   stable  CONSULTS OBTAINED:  Treatment Team:  Catarina Hartshorn, MD Alexis Goodell, MD  DRUG ALLERGIES:  No Known Allergies  DISCHARGE MEDICATIONS:   Current Discharge Medication List    START taking these medications   Details  clopidogrel (PLAVIX) 75 MG tablet Take 1 tablet (75 mg total) by mouth daily. Qty: 30 tablet, Refills: 0      CONTINUE these medications which have NOT CHANGED   Details  aspirin EC 81 MG tablet Take 1 tablet (81 mg total) by mouth daily. Qty: 30 tablet, Refills: 0    atorvastatin (LIPITOR) 40 MG tablet Take 40 mg by mouth at bedtime.    Calcium Carbonate-Vitamin D (CALCIUM 600+D) 600-400 MG-UNIT tablet Take 1 tablet by mouth daily.     diazepam (VALIUM) 5 MG tablet Take 5 mg by mouth at bedtime as needed for muscle spasms.     diphenhydrAMINE (BENADRYL) 25 MG tablet Take 25 mg by mouth at bedtime as needed for allergies.     donepezil (ARICEPT) 10 MG tablet Take 10 mg by mouth at bedtime.    DULoxetine (CYMBALTA) 60 MG capsule Take 60 mg by mouth daily.    Multiple Vitamin (MULTIVITAMIN WITH MINERALS) TABS tablet Take 1 tablet by mouth daily.      STOP taking these medications     HYDROcodone-acetaminophen (NORCO) 7.5-325 MG tablet          DISCHARGE INSTRUCTIONS:    DIET:  Regular diet  DISCHARGE CONDITION:  Good  ACTIVITY:  Activity as tolerated  OXYGEN:  Home Oxygen: No.   Oxygen Delivery: room air  DISCHARGE LOCATION:  nursing home   If you experience worsening of your admission symptoms, develop shortness of breath, life threatening emergency, suicidal or homicidal thoughts you must seek medical attention immediately by calling 911 or calling your MD immediately  if symptoms less severe.  You Must read complete instructions/literature along with all the possible adverse reactions/side effects for all the Medicines you take and that have been prescribed to you. Take any new Medicines after you have completely understood and accpet all the possible adverse reactions/side effects.   Please note  You were cared for by a hospitalist during your hospital stay. If you have any questions  about your discharge medications or the care you received while you were in the hospital after you are discharged, you can call the unit and asked to speak with the hospitalist on call if the hospitalist that took care of you is not available. Once you are discharged, your primary care physician will handle any further medical issues. Please note that NO REFILLS for any discharge medications will be authorized once you are discharged, as it is imperative that you return to your primary care physician (or establish a  relationship with a primary care physician if you do not have one) for your aftercare needs so that they can reassess your need for medications and monitor your lab values.    On the day of Discharge:  VITAL SIGNS:  Blood pressure 132/75, pulse 95, temperature 98.6 F (37 C), temperature source Oral, resp. rate 18, height 5\' 2"  (1.575 m), weight 42.23 kg (93 lb 1.6 oz), SpO2 93 %. PHYSICAL EXAMINATION:  GENERAL:  82 y.o.-year-old patient lying in the bed with no acute distress.  EYES: Pupils equal, round, reactive to light and accommodation. No scleral icterus. Extraocular muscles intact.  HEENT: Head atraumatic, normocephalic. Oropharynx and nasopharynx clear.  NECK:  Supple, no jugular venous distention. No thyroid enlargement, no tenderness.  LUNGS: Normal breath sounds bilaterally, no wheezing, rales,rhonchi or crepitation. No use of accessory muscles of respiration.  CARDIOVASCULAR: S1, S2 normal. No murmurs, rubs, or gallops.  ABDOMEN: Soft, non-tender, non-distended. Bowel sounds present. No organomegaly or mass.  EXTREMITIES: No pedal edema, cyanosis, or clubbing.  NEUROLOGIC: Cranial nerves II through XII are intact. Muscle strength 5/5 in all extremities. Sensation intact. Gait not checked.  PSYCHIATRIC: The patient is alert and oriented x 3.  SKIN: No obvious rash, lesion, or ulcer.  DATA REVIEW:   CBC  Recent Labs Lab 01/13/16 0436  WBC 7.5  HGB 11.5*  HCT 33.5*  PLT 87*    Chemistries   Recent Labs Lab 01/13/16 0436  NA 136  K 3.5  CL 107  CO2 25  GLUCOSE 99  BUN 12  CREATININE 0.73  CALCIUM 9.6   Management plans discussed with the patient, family and they are in agreement.  CODE STATUS: DNR  TOTAL TIME TAKING CARE OF THIS PATIENT: 45 minutes.    [facility], Lachandra Dettmann M.D on 01/16/2016 at 7:52 AM  Between 7am to 6pm - Pager - [phone]  After 6pm go to [email or web address] - password [email or web address] [facility]  Office  [phone]  CC: Primary  care physician; Albina Billet, MD   Note: This dictation was prepared with Dragon dictation along with smaller phrase technology. Any transcriptional errors that result from this process are unintentional.

## 2016-01-16 NOTE — Clinical Social Work Note (Signed)
Pt is ready for discharge today to [facility]. Pt and family are aware and agreeable to discharge plan. CSW sent discharge information to facility and they are ready to accept pt. RN will call report to Room #215-B at [phone]. CSW also provided information for dementia services and Palliative Care NP consultation services. CSW is signing off as no further needs identified.   Darden Dates, MSW, LCSW Clinical Social Worker  [phone]

## 2016-01-16 NOTE — Discharge Instructions (Signed)
Stroke Prevention °Some health problems and behaviors may make it more likely for you to have a stroke. Below are ways to lessen your risk of having a stroke.  °· Be active for at least 30 minutes on most or all days. °· Do not smoke. Try not to be around others who smoke. °· Do not drink too much alcohol. °¨ Do not have more than 2 drinks a day if you are a man. °¨ Do not have more than 1 drink a day if you are a woman and are not pregnant. °· Eat healthy foods, such as fruits and vegetables. If you were put on a specific diet, follow the diet as told. °· Keep your cholesterol levels under control through diet and medicines. Look for foods that are low in saturated fat, trans fat, cholesterol, and are high in fiber. °· If you have diabetes, follow all diet plans and take your medicine as told. °· Ask your doctor if you need treatment to lower your blood pressure. If you have high blood pressure (hypertension), follow all diet plans and take your medicine as told by your doctor. °· If you are 18-39 years old, have your blood pressure checked every 3-5 years. If you are age 40 or older, have your blood pressure checked every year. °· Keep a healthy weight. Eat foods that are low in calories, salt, saturated fat, trans fat, and cholesterol. °· Do not take drugs. °· Avoid birth control pills, if this applies. Talk to your doctor about the risks of taking birth control pills. °· Talk to your doctor if you have sleep problems (sleep apnea). °· Take all medicine as told by your doctor. °¨ You may be told to take aspirin or blood thinner medicine. Take this medicine as told by your doctor. °¨ Understand your medicine instructions. °· Make sure any other conditions you have are being taken care of. °GET HELP RIGHT AWAY IF: °· You suddenly lose feeling (you feel numb) or have weakness in your face, arm, or leg. °· Your face or eyelid hangs down to one side. °· You suddenly feel confused. °· You have trouble talking (aphasia)  or understanding what people are saying. °· You suddenly have trouble seeing in one or both eyes. °· You suddenly have trouble walking. °· You are dizzy. °· You lose your balance or your movements are clumsy (uncoordinated). °· You suddenly have a very bad headache and you do not know the cause. °· You have new chest pain. °· Your heart feels like it is fluttering or skipping a beat (irregular heartbeat). °Do not wait to see if the symptoms above go away. Get help right away. Call your local emergency services (911 in [place] Do not drive yourself to the hospital. °  °This information is not intended to replace advice given to you by your health care provider. Make sure you discuss any questions you have with your health care provider. °  °Document Released: 03/28/2012 Document Revised: 10/18/2014 Document Reviewed: 03/30/2013 °Elsevier Interactive Patient Education ©2016 [employer] ° °

## 2016-01-17 LAB — URINALYSIS COMPLETE WITH MICROSCOPIC (ARMC ONLY)
BILIRUBIN URINE: NEGATIVE
Glucose, UA: NEGATIVE mg/dL
HGB URINE DIPSTICK: NEGATIVE
Ketones, ur: NEGATIVE mg/dL
Leukocytes, UA: NEGATIVE
Nitrite: NEGATIVE
PH: 7 (ref 5.0–8.0)
PROTEIN: NEGATIVE mg/dL
Specific Gravity, Urine: 1.01 (ref 1.005–1.030)

## 2016-01-19 LAB — URINE CULTURE

## 2016-01-21 LAB — URINALYSIS COMPLETE WITH MICROSCOPIC (ARMC ONLY)
BILIRUBIN URINE: NEGATIVE
GLUCOSE, UA: NEGATIVE mg/dL
HGB URINE DIPSTICK: NEGATIVE
Ketones, ur: NEGATIVE mg/dL
Leukocytes, UA: NEGATIVE
Nitrite: NEGATIVE
Protein, ur: NEGATIVE mg/dL
RBC / HPF: NONE SEEN RBC/hpf (ref 0–5)
SPECIFIC GRAVITY, URINE: 1.011 (ref 1.005–1.030)
pH: 7 (ref 5.0–8.0)

## 2016-01-23 LAB — URINE CULTURE: Culture: NO GROWTH

## 2016-01-24 LAB — URINALYSIS COMPLETE WITH MICROSCOPIC (ARMC ONLY)
BACTERIA UA: NONE SEEN
Bilirubin Urine: NEGATIVE
GLUCOSE, UA: NEGATIVE mg/dL
HGB URINE DIPSTICK: NEGATIVE
Ketones, ur: NEGATIVE mg/dL
NITRITE: POSITIVE — AB
PH: 6 (ref 5.0–8.0)
PROTEIN: 100 mg/dL — AB
SPECIFIC GRAVITY, URINE: 1.012 (ref 1.005–1.030)
SQUAMOUS EPITHELIAL / LPF: NONE SEEN

## 2016-01-26 LAB — URINE CULTURE: Culture: >=100000 — AB

## 2016-01-27 ENCOUNTER — Other Ambulatory Visit: Payer: Self-pay

## 2016-01-27 ENCOUNTER — Ambulatory Visit: Payer: Self-pay | Admitting: Family Medicine

## 2016-01-29 ENCOUNTER — Emergency Department: Payer: Medicare Other

## 2016-01-29 ENCOUNTER — Emergency Department
Admission: EM | Admit: 2016-01-29 | Discharge: 2016-01-29 | Disposition: A | Discharge status: Skilled Nursing Facility | Payer: Medicare Other | Attending: Emergency Medicine | Admitting: Emergency Medicine

## 2016-01-29 ENCOUNTER — Encounter: Payer: Self-pay | Admitting: Emergency Medicine

## 2016-01-29 DIAGNOSIS — Z79899 Other long term (current) drug therapy: Secondary | ICD-10-CM | POA: Diagnosis not present

## 2016-01-29 DIAGNOSIS — Y939 Activity, unspecified: Secondary | ICD-10-CM | POA: Insufficient documentation

## 2016-01-29 DIAGNOSIS — S51012A Laceration without foreign body of left elbow, initial encounter: Secondary | ICD-10-CM | POA: Diagnosis present

## 2016-01-29 DIAGNOSIS — F0390 Unspecified dementia without behavioral disturbance: Secondary | ICD-10-CM | POA: Insufficient documentation

## 2016-01-29 DIAGNOSIS — Z859 Personal history of malignant neoplasm, unspecified: Secondary | ICD-10-CM | POA: Insufficient documentation

## 2016-01-29 DIAGNOSIS — Y999 Unspecified external cause status: Secondary | ICD-10-CM | POA: Diagnosis not present

## 2016-01-29 DIAGNOSIS — Z8673 Personal history of transient ischemic attack (TIA), and cerebral infarction without residual deficits: Secondary | ICD-10-CM | POA: Insufficient documentation

## 2016-01-29 DIAGNOSIS — Z7982 Long term (current) use of aspirin: Secondary | ICD-10-CM | POA: Insufficient documentation

## 2016-01-29 DIAGNOSIS — Y92002 Bathroom of unspecified non-institutional (private) residence single-family (private) house as the place of occurrence of the external cause: Secondary | ICD-10-CM | POA: Insufficient documentation

## 2016-01-29 DIAGNOSIS — W19XXXA Unspecified fall, initial encounter: Secondary | ICD-10-CM | POA: Insufficient documentation

## 2016-01-29 HISTORY — DX: Cerebral infarction, unspecified: I63.9

## 2016-01-29 LAB — BASIC METABOLIC PANEL
Anion gap: 9 (ref 5–15)
BUN: 15 mg/dL (ref 6–20)
CALCIUM: 10.4 mg/dL — AB (ref 8.9–10.3)
CO2: 21 mmol/L — ABNORMAL LOW (ref 22–32)
CREATININE: 1.22 mg/dL — AB (ref 0.44–1.00)
Chloride: 106 mmol/L (ref 101–111)
GFR, EST AFRICAN AMERICAN: 45 mL/min — AB (ref >60)
GFR, EST NON AFRICAN AMERICAN: 39 mL/min — AB (ref >60)
Glucose, Bld: 182 mg/dL — ABNORMAL HIGH (ref 65–99)
Potassium: 4.1 mmol/L (ref 3.5–5.1)
SODIUM: 136 mmol/L (ref 135–145)

## 2016-01-29 LAB — TROPONIN I

## 2016-01-29 LAB — BLOOD GAS, ARTERIAL
ACID-BASE DEFICIT: 1.2 mmol/L (ref 0.0–2.0)
ALLENS TEST (PASS/FAIL): POSITIVE — AB
Bicarbonate: 22.4 mEq/L (ref 21.0–28.0)
FIO2: 0.32
O2 Saturation: 95.6 %
PH ART: 7.44 (ref 7.350–7.450)
Patient temperature: 37
pCO2 arterial: 33 mmHg (ref 32.0–48.0)
pO2, Arterial: 76 mmHg — ABNORMAL LOW (ref 83.0–108.0)

## 2016-01-29 LAB — URINALYSIS COMPLETE WITH MICROSCOPIC (ARMC ONLY)
BILIRUBIN URINE: NEGATIVE
Bacteria, UA: NONE SEEN
GLUCOSE, UA: NEGATIVE mg/dL
Hgb urine dipstick: NEGATIVE
KETONES UR: NEGATIVE mg/dL
Leukocytes, UA: NEGATIVE
Nitrite: NEGATIVE
PH: 7 (ref 5.0–8.0)
Protein, ur: NEGATIVE mg/dL
Specific Gravity, Urine: 1.011 (ref 1.005–1.030)

## 2016-01-29 LAB — CBC WITH DIFFERENTIAL/PLATELET
BASOS PCT: 0 %
Basophils Absolute: 0 10*3/uL (ref 0–0.1)
EOS ABS: 0.1 10*3/uL (ref 0–0.7)
EOS PCT: 1 %
HCT: 33.9 % — ABNORMAL LOW (ref 35.0–47.0)
HEMOGLOBIN: 11.4 g/dL — AB (ref 12.0–16.0)
LYMPHS ABS: 1.7 10*3/uL (ref 1.0–3.6)
Lymphocytes Relative: 15 %
MCH: 31.8 pg (ref 26.0–34.0)
MCHC: 33.5 g/dL (ref 32.0–36.0)
MCV: 95 fL (ref 80.0–100.0)
MONOS PCT: 4 %
Monocytes Absolute: 0.4 10*3/uL (ref 0.2–0.9)
NEUTROS PCT: 80 %
Neutro Abs: 9 10*3/uL — ABNORMAL HIGH (ref 1.4–6.5)
PLATELETS: 171 10*3/uL (ref 150–440)
RBC: 3.57 MIL/uL — ABNORMAL LOW (ref 3.80–5.20)
RDW: 14.3 % (ref 11.5–14.5)
WBC: 11.2 10*3/uL — ABNORMAL HIGH (ref 3.6–11.0)

## 2016-01-29 MED ORDER — IOPAMIDOL (ISOVUE-370) INJECTION 76%
75.0000 mL | Freq: Once | INTRAVENOUS | Status: AC | PRN
  Administered 2016-01-29: 75 mL via INTRAVENOUS

## 2016-01-29 MED ORDER — SODIUM CHLORIDE 0.9 % IV BOLUS (SEPSIS)
1000.0000 mL | Freq: Once | INTRAVENOUS | Status: AC
  Administered 2016-01-29: 1000 mL via INTRAVENOUS

## 2016-01-29 NOTE — Discharge Instructions (Signed)
1. Patient was transiently hypoxic and may require nasal cannula oxygen as needed. Her chest x-ray and chest CT were negative for infection or pulmonary embolus. 2. Keep wound clean and dry. 3. Return to the ER for worsening symptoms, persistent vomiting, difficult breathing or other concerns.  Fall Prevention in Hospitals, Adult As a hospital patient, your condition and the treatments you receive can increase your risk for falls. Some additional risk factors for falls in a hospital include:  Being in an unfamiliar environment.  Being on bed rest.  Your surgery.  Taking certain medicines.  Your tubing requirements, such as intravenous (IV) therapy or catheters. It is important that you learn how to decrease fall risks while at the hospital. Below are important tips that can help prevent falls. SAFETY TIPS FOR PREVENTING FALLS Talk about your risk of falling.  Ask your health care provider why you are at risk for falling. Is it your medicine, illness, tubing placement, or something else?  Make a plan with your health care provider to keep you safe from falls.  Ask your health care provider or pharmacist about side effects of your medicines. Some medicines can make you dizzy or affect your coordination. Ask for help.  Ask for help before getting out of bed. You may need to press your call button.  Ask for assistance in getting safely to the toilet.  Ask for a walker or cane to be put at your bedside. Ask that most of the side rails on your bed be placed up before your health care provider leaves the room.  Ask family or friends to sit with you.  Ask for things that are out of your reach, such as your glasses, hearing aids, telephone, bedside table, or call button. Follow these tips to avoid falling:  Stay lying or seated, rather than standing, while waiting for help.  Wear rubber-soled slippers or shoes whenever you walk in the hospital.  Avoid quick, sudden  movements.  Change positions slowly.  Sit on the side of your bed before standing.  Stand up slowly and wait before you start to walk.  Let your health care provider know if there is a spill on the floor.  Pay careful attention to the medical equipment, electrical cords, and tubes around you.  When you need help, use your call button by your bed or in the bathroom. Wait for one of your health care providers to help you.  If you feel dizzy or unsure of your footing, return to bed and wait for assistance.  Avoid being distracted by the TV, telephone, or another person in your room.  Do not lean or support yourself on rolling objects, such as IV poles or bedside tables.   This information is not intended to replace advice given to you by your health care provider. Make sure you discuss any questions you have with your health care provider.   Document Released: 09/24/2000 Document Revised: 10/18/2014 Document Reviewed: 06/04/2012 Elsevier Interactive Patient Education 2016 [employer]  Skin Tear Care A skin tear is when the top layer of skin peels off. To repair the skin, your doctor may use:   Tape.  Skin adhesive strips. HOME CARE  Change bandages (dressings) once a day or as told by your doctor.  Gently clean the area with salt (saline) solution or with a mild soap and water.  Do not rub the injured skin dry. Let the area air dry.  Put petroleum jelly or antibiotic cream on the tear.  Do not allow a scab to form.  If the bandage sticks, moisten it with warm soapy water and remove it.  Protect the injured skin until it has healed.  Only take medicine as told by your doctor.  Take showers or baths using warm soapy water. Apply a new bandage after the shower or bath.  Keep all doctor visits as told. GET HELP RIGHT AWAY IF:   You have redness, puffiness (swelling), or more pain in the tear.  You have ayellowish-white fluid (pus) coming from the tear.  You have  chills.  You have a red streak that goes away from the tear.  You have a bad smell coming from the tear or bandage.  You have a fever or lasting symptoms for more than 2-3 days.  You have a fever and your symptoms suddenly get worse. MAKE SURE YOU:   Understand these instructions.  Will watch this condition.  Will get help right away if you are not doing well or get worse.   This information is not intended to replace advice given to you by your health care provider. Make sure you discuss any questions you have with your health care provider.   Document Released: 07/06/2008 Document Revised: 06/21/2012 Document Reviewed: 04/10/2012 Elsevier Interactive Patient Education [employer].

## 2016-01-29 NOTE — ED Notes (Signed)
Patient transported to CT/Xray. 

## 2016-01-29 NOTE — ED Notes (Signed)
Report called Doreatha Massed LPN

## 2016-01-29 NOTE — ED Notes (Signed)
Patient continues to be responsive, states she feels better than when she arrived to ER. Patient's sats dropped when O2 removed for trial (O2 sats were 100% on 2L). Sats dropped to 89% without O2. O2 reapplied via nasal cannula at 2L, O2 sats immediately returned to 92. Now 99% on 2L. Patient in no respiratory distress and continues to deny shortness of breath or chest pain.

## 2016-01-29 NOTE — ED Notes (Signed)
Patient's oxygen turned off to trial tolerance on room air.

## 2016-01-29 NOTE — ED Notes (Signed)
Patient to ER via [facility] from [facility] at [place] for c/o unwitnessed fall. Patient has h/o CVA and dementia, and is therefore poor historian. Staff state patient was found in bathroom floor (normally ambulatory independently). Patient has gauze wrap to left elbow from skin tear per staff. Patient leaning to right side upon arrival and spitting/drooling. Patient's O2 sats also low upon arrival (87% on RA), does not normally require oxygen supplement.

## 2016-01-29 NOTE — ED Provider Notes (Signed)
[facility] Emergency Department Provider Note  ____________________________________________  Time seen: Approximately 3:32 AM  I have reviewed the triage vital signs and the nursing notes.   HISTORY  Chief Complaint Fall    HPI Savannah Lee is a 82 y.o. female who presents to the ED from nursing facility with a chief complaint of unwitnessed fall. Patient has a history of dementia and CVA, recently hospitalized for CVA with new left facial droop, who was found on the floor by nursing staff. She is reportedly ambulatory with assistance. Patient does not recall recent events of her fall. Does not think she experienced LOC. Does not complain of pain. Patient and spouse deny recent fever, chills, chest pain, shortness of breath, abdominal pain, nausea, vomiting, diarrhea.   Past Medical History  Diagnosis Date  . Dementia   . TIA (transient ischemic attack)   . Cancer ([place])   . Stroke ([place])   . CVA (cerebral infarction)     Left side affected    Patient Active Problem List   Diagnosis Date Noted  . Protein-calorie malnutrition, severe 01/13/2016  . Generalized weakness 01/12/2016  . Rash 01/12/2016  . Thrombocytopenia ([place]) 01/12/2016  . Facial weakness 01/12/2016  . CVA (cerebral infarction) 08/12/2015    Past Surgical History  Procedure Laterality Date  . Abdominal hysterectomy    . Breast surgery    . Cataract extraction      Current Outpatient Rx  Name  Route  Sig  Dispense  Refill  . aspirin EC 81 MG tablet   Oral   Take 1 tablet (81 mg total) by mouth daily.   30 tablet   0   . atorvastatin (LIPITOR) 40 MG tablet   Oral   Take 40 mg by mouth at bedtime.         . bisacodyl (DULCOLAX) 10 MG suppository   Rectal   Place 10 mg rectally daily as needed for moderate constipation.         . Calcium Carbonate-Vitamin D (CALCIUM 600+D) 600-400 MG-UNIT tablet   Oral   Take 1 tablet by mouth daily.         . clopidogrel (PLAVIX)  75 MG tablet   Oral   Take 1 tablet (75 mg total) by mouth daily.   30 tablet   0   . diazepam (VALIUM) 5 MG tablet   Oral   Take 5 mg by mouth at bedtime as needed for muscle spasms.          . diphenhydrAMINE (BENADRYL) 25 MG tablet   Oral   Take 25 mg by mouth at bedtime as needed for allergies.          Marland Kitchen docusate sodium (COLACE) 100 MG capsule   Oral   Take 100 mg by mouth daily.         Marland Kitchen donepezil (ARICEPT) 10 MG tablet   Oral   Take 10 mg by mouth at bedtime.         . DULoxetine (CYMBALTA) 60 MG capsule   Oral   Take 60 mg by mouth daily.         . mirtazapine (REMERON) 15 MG tablet   Oral   Take 7.5 mg by mouth at bedtime.         . Multiple Vitamin (MULTIVITAMIN WITH MINERALS) TABS tablet   Oral   Take 1 tablet by mouth daily.         . phenylephrine-shark liver oil-mineral oil-petrolatum (  PREPARATION H) 0.25-3-14-71.9 % rectal ointment   Rectal   Place 1 application rectally as needed for hemorrhoids.         Marland Kitchen senna-docusate (SENOKOT-S) 8.6-50 MG tablet   Oral   Take 1 tablet by mouth 2 (two) times daily.         . sodium phosphate (FLEET) enema   Rectal   Place 1 enema rectally every three (3) days as needed. follow package directions         . tamsulosin (FLOMAX) 0.4 MG CAPS capsule   Oral   Take 0.4 mg by mouth at bedtime.         Marland Kitchen zinc oxide 20 % ointment   Topical   Apply 1 application topically as needed for irritation (For skin irriation prn).           Allergies Review of patient's allergies indicates no known allergies.  Family History  Problem Relation Age of Onset  . Cancer Father     Social History Social History  Substance Use Topics  . Smoking status: Never Smoker   . Smokeless tobacco: None  . Alcohol Use: No    Review of Systems  Constitutional: No fever/chills. Eyes: No visual changes. ENT: No sore throat. Cardiovascular: Denies chest pain. Respiratory: Denies shortness of  breath. Gastrointestinal: No abdominal pain.  No nausea, no vomiting.  No diarrhea.  No constipation. Genitourinary: Negative for dysuria. Musculoskeletal: Negative for back pain. Skin: Negative for rash. Neurological: Negative for headaches, focal weakness or numbness.  10-point ROS otherwise negative.  ____________________________________________   PHYSICAL EXAM:  VITAL SIGNS: ED Triage Vitals  Enc Vitals Group     BP 01/29/16 0323 136/92 mmHg     Pulse Rate 01/29/16 0323 92     Resp 01/29/16 0323 21     Temp 01/29/16 0323 97.3 F (36.3 C)     Temp Source 01/29/16 0323 Oral     SpO2 01/29/16 0323 87 %     Weight 01/29/16 0323 95 lb 14.4 oz (43.5 kg)     Height 01/29/16 0323 5\' 6"  (1.676 m)     Head Cir --      Peak Flow --      Pain Score 01/29/16 0325 0     Pain Loc --      Pain Edu? --      Excl. in [place]? --     Constitutional: Alert and oriented. Cachectic appearing and in no acute distress. Eyes: Conjunctivae are normal. PERRL. EOMI. Arcus senilis. Head: Atraumatic. Nose: No congestion/rhinnorhea. Mouth/Throat: Mucous membranes are moist.  Oropharynx non-erythematous. Neck: No stridor.  No cervical spine tenderness to palpation.  No step-offs or deformities noted. Cardiovascular: Normal rate, regular rhythm. Grossly normal heart sounds.  Good peripheral circulation. Respiratory: Normal respiratory effort.  No retractions. Lungs CTAB. Gastrointestinal: Soft and nontender. No distention. No abdominal bruits. No CVA tenderness. Musculoskeletal: Left elbow with skin tear. Full range of motion without pain. No lower extremity tenderness nor edema.  No joint effusions. Neurologic:  Normal speech and language. Alert and oriented to person. No gross focal neurologic deficits are appreciated.  Skin:  Skin is warm, dry and intact. No rash noted. Psychiatric: Mood and affect are normal. Speech and behavior are normal.  ____________________________________________    LABS (all labs ordered are listed, but only abnormal results are displayed)  Labs Reviewed  CBC WITH DIFFERENTIAL/PLATELET - Abnormal; Notable for the following:    WBC 11.2 (*)    RBC 3.57 (*)  Hemoglobin 11.4 (*)    HCT 33.9 (*)    Neutro Abs 9.0 (*)    All other components within normal limits  BASIC METABOLIC PANEL - Abnormal; Notable for the following:    CO2 21 (*)    Glucose, Bld 182 (*)    Creatinine, Ser 1.22 (*)    Calcium 10.4 (*)    GFR calc non Af Amer 39 (*)    GFR calc Af Amer 45 (*)    All other components within normal limits  URINALYSIS COMPLETEWITH MICROSCOPIC ([facility] ONLY) - Abnormal; Notable for the following:    Color, Urine YELLOW (*)    APPearance HAZY (*)    Squamous Epithelial / LPF 0-5 (*)    All other components within normal limits  BLOOD GAS, ARTERIAL - Abnormal; Notable for the following:    pO2, Arterial 76 (*)    Allens test (pass/fail) POSITIVE (*)    All other components within normal limits  TROPONIN I   ____________________________________________  EKG  ED ECG REPORT I, SUNG,JADE J, the attending physician, personally viewed and interpreted this ECG.   Date: 01/29/2016  EKG Time: 0317  Rate: 93  Rhythm: normal EKG, normal sinus rhythm  Axis: Normal  Intervals:none  ST&T Change: Nonspecific  ____________________________________________  RADIOLOGY  CT head without contrast interpreted per Dr. Dorann Lodge: No acute intracranial process.  Stable chronic changes including moderate to severe chronic small vessel ischemic disease, old LEFT thalamus lacunar infarct and old small LEFT cerebellar infarct.  Chest 1 view (viewed by me, interpreted per Dr. Gerilyn Nestle): No active disease.  Left elbow complete (viewed by me, interpreted per Dr. Gerilyn Nestle): Negative.  CT chest interpreted per Dr. Gerilyn Nestle: No evidence of significant pulmonary embolus.  Multiple bilateral pulmonary nodules, many with ground-glass appearance. These could  represent inflammatory or metastatic lesions and follow-up in 3 months is suggested.  There appears to be an enlarging solid nodule in the upper pole of the left kidney and there is nonspecific soft tissue process demonstrated in the left upper quadrant. CT abdomen and pelvis suggested for further evaluation. ____________________________________________   PROCEDURES  Procedure(s) performed: None  Critical Care performed: No  ____________________________________________   INITIAL IMPRESSION / ASSESSMENT AND PLAN / ED COURSE  Pertinent labs & imaging results that were available during my care of the patient were reviewed by me and considered in my medical decision making (see chart for details).  82 year old female who presents from nursing facility with unwitnessed fall. Recently hospitalized for stroke. We'll obtain CT head, screening lab work, chest x-ray to evaluate hypoxemia, left elbow x-ray given skin tear and reassess.  ----------------------------------------- 5:45 AM on 01/29/2016 -----------------------------------------  Updated patient and spouse of imaging and laboratory results. Had tried patient off nasal cannula oxygen and she initially did fine; however, room air saturations now 89%. Given patient's recent hospitalization and nursing home status, she is at risk for pulmonary embolus. Will obtain CT chest.  ----------------------------------------- 7:02 AM on 01/29/2016 -----------------------------------------  Updated patient and spouse of CT results. Upon consultation with case manager, patient does not meet inpatient or observational criteria for mild and transient hypoxemia. Will instruct nursing facility to place patient on oxygen as needed. She is to follow-up closely this week with her PCP. Strict return precautions given. Patient and her spouse verbalize understanding and agree with plan of care. ____________________________________________   FINAL  CLINICAL IMPRESSION(S) / ED DIAGNOSES  Final diagnoses:  Fall, initial encounter  Skin tear of elbow without complication, left, initial  encounter      Paulette Blanch, MD 01/29/16 [phone]

## 2016-01-29 NOTE — ED Notes (Signed)
Patient's O2 reduced to 2L, as patient's sats now 97-100% on 3L.

## 2016-01-30 LAB — COMPREHENSIVE METABOLIC PANEL
ALBUMIN: 3.5 g/dL (ref 3.5–5.0)
ALK PHOS: 63 U/L (ref 38–126)
ALT: 27 U/L (ref 14–54)
AST: 29 U/L (ref 15–41)
Anion gap: 9 (ref 5–15)
BILIRUBIN TOTAL: 0.9 mg/dL (ref 0.3–1.2)
BUN: 20 mg/dL (ref 6–20)
CALCIUM: 10.1 mg/dL (ref 8.9–10.3)
CO2: 20 mmol/L — AB (ref 22–32)
CREATININE: 1.24 mg/dL — AB (ref 0.44–1.00)
Chloride: 107 mmol/L (ref 101–111)
GFR calc Af Amer: 45 mL/min — ABNORMAL LOW (ref >60)
GFR calc non Af Amer: 38 mL/min — ABNORMAL LOW (ref >60)
GLUCOSE: 207 mg/dL — AB (ref 65–99)
Potassium: 4.2 mmol/L (ref 3.5–5.1)
SODIUM: 136 mmol/L (ref 135–145)
Total Protein: 6.1 g/dL — ABNORMAL LOW (ref 6.5–8.1)

## 2016-01-30 LAB — CBC WITH DIFFERENTIAL/PLATELET
BASOS PCT: 0 %
Basophils Absolute: 0 10*3/uL (ref 0–0.1)
EOS ABS: 0.1 10*3/uL (ref 0–0.7)
Eosinophils Relative: 1 %
HEMATOCRIT: 32.3 % — AB (ref 35.0–47.0)
HEMOGLOBIN: 10.8 g/dL — AB (ref 12.0–16.0)
LYMPHS ABS: 0.9 10*3/uL — AB (ref 1.0–3.6)
Lymphocytes Relative: 9 %
MCH: 32.1 pg (ref 26.0–34.0)
MCHC: 33.6 g/dL (ref 32.0–36.0)
MCV: 95.6 fL (ref 80.0–100.0)
Monocytes Absolute: 0.3 10*3/uL (ref 0.2–0.9)
Monocytes Relative: 3 %
NEUTROS ABS: 9.2 10*3/uL — AB (ref 1.4–6.5)
NEUTROS PCT: 87 %
Platelets: 113 10*3/uL — ABNORMAL LOW (ref 150–440)
RBC: 3.37 MIL/uL — AB (ref 3.80–5.20)
RDW: 14.2 % (ref 11.5–14.5)
WBC: 10.6 10*3/uL (ref 3.6–11.0)

## 2016-01-30 LAB — VITAMIN B12: Vitamin B-12: 391 pg/mL (ref 180–914)

## 2016-01-30 LAB — TSH: TSH: 2.86 u[IU]/mL (ref 0.350–4.500)

## 2016-01-31 LAB — VITAMIN D 25 HYDROXY (VIT D DEFICIENCY, FRACTURES): VIT D 25 HYDROXY: 36.1 ng/mL (ref 30.0–100.0)

## 2016-01-31 LAB — PARATHYROID HORMONE, INTACT (NO CA): PTH: 52 pg/mL (ref 15–65)

## 2016-01-31 LAB — T4: T4 TOTAL: 7.4 ug/dL (ref 4.5–12.0)

## 2016-02-03 ENCOUNTER — Encounter
Admission: RE | Admit: 2016-02-03 | Discharge: 2016-02-03 | Disposition: A | Discharge status: Home / Self Care | Payer: Medicare Other | Source: Other Acute Inpatient Hospital | Attending: Internal Medicine | Admitting: Internal Medicine

## 2016-02-03 DIAGNOSIS — Z029 Encounter for administrative examinations, unspecified: Secondary | ICD-10-CM | POA: Diagnosis not present

## 2016-02-03 LAB — URINALYSIS COMPLETE WITH MICROSCOPIC (ARMC ONLY)
BILIRUBIN URINE: NEGATIVE
Glucose, UA: NEGATIVE mg/dL
Hgb urine dipstick: NEGATIVE
Leukocytes, UA: NEGATIVE
NITRITE: NEGATIVE
PH: 5 (ref 5.0–8.0)
PROTEIN: NEGATIVE mg/dL
Specific Gravity, Urine: 1.017 (ref 1.005–1.030)

## 2016-02-04 LAB — URINALYSIS COMPLETE WITH MICROSCOPIC (ARMC ONLY)
Bilirubin Urine: NEGATIVE
Glucose, UA: NEGATIVE mg/dL
HGB URINE DIPSTICK: NEGATIVE
Leukocytes, UA: NEGATIVE
NITRITE: NEGATIVE
PROTEIN: NEGATIVE mg/dL
SPECIFIC GRAVITY, URINE: 1.02 (ref 1.005–1.030)
pH: 5 (ref 5.0–8.0)

## 2016-02-06 LAB — URINE CULTURE

## 2016-02-09 ENCOUNTER — Encounter
Admission: RE | Admit: 2016-02-09 | Discharge: 2016-02-09 | Disposition: A | Discharge status: Home / Self Care | Payer: Medicare Other | Source: Ambulatory Visit | Attending: Internal Medicine | Admitting: Internal Medicine

## 2016-03-11 DEATH — deceased

## 2016-05-19 ENCOUNTER — Other Ambulatory Visit: Payer: Self-pay | Admitting: *Deleted

## 2016-06-30 ENCOUNTER — Other Ambulatory Visit: Payer: Self-pay | Admitting: *Deleted

## 2016-12-21 IMAGING — CR DG CHEST 1V
1 series · 2 of 2 positions shown · non-contrast
Comparison: 01/12/2016

CLINICAL DATA: Unwitnessed fall.  Low oxygen saturation.

EXAM:
CHEST 1 VIEW

[Series 1: x chest ap · 0.14mm/px · 2 of 2 slices shown]
[im 1/2]
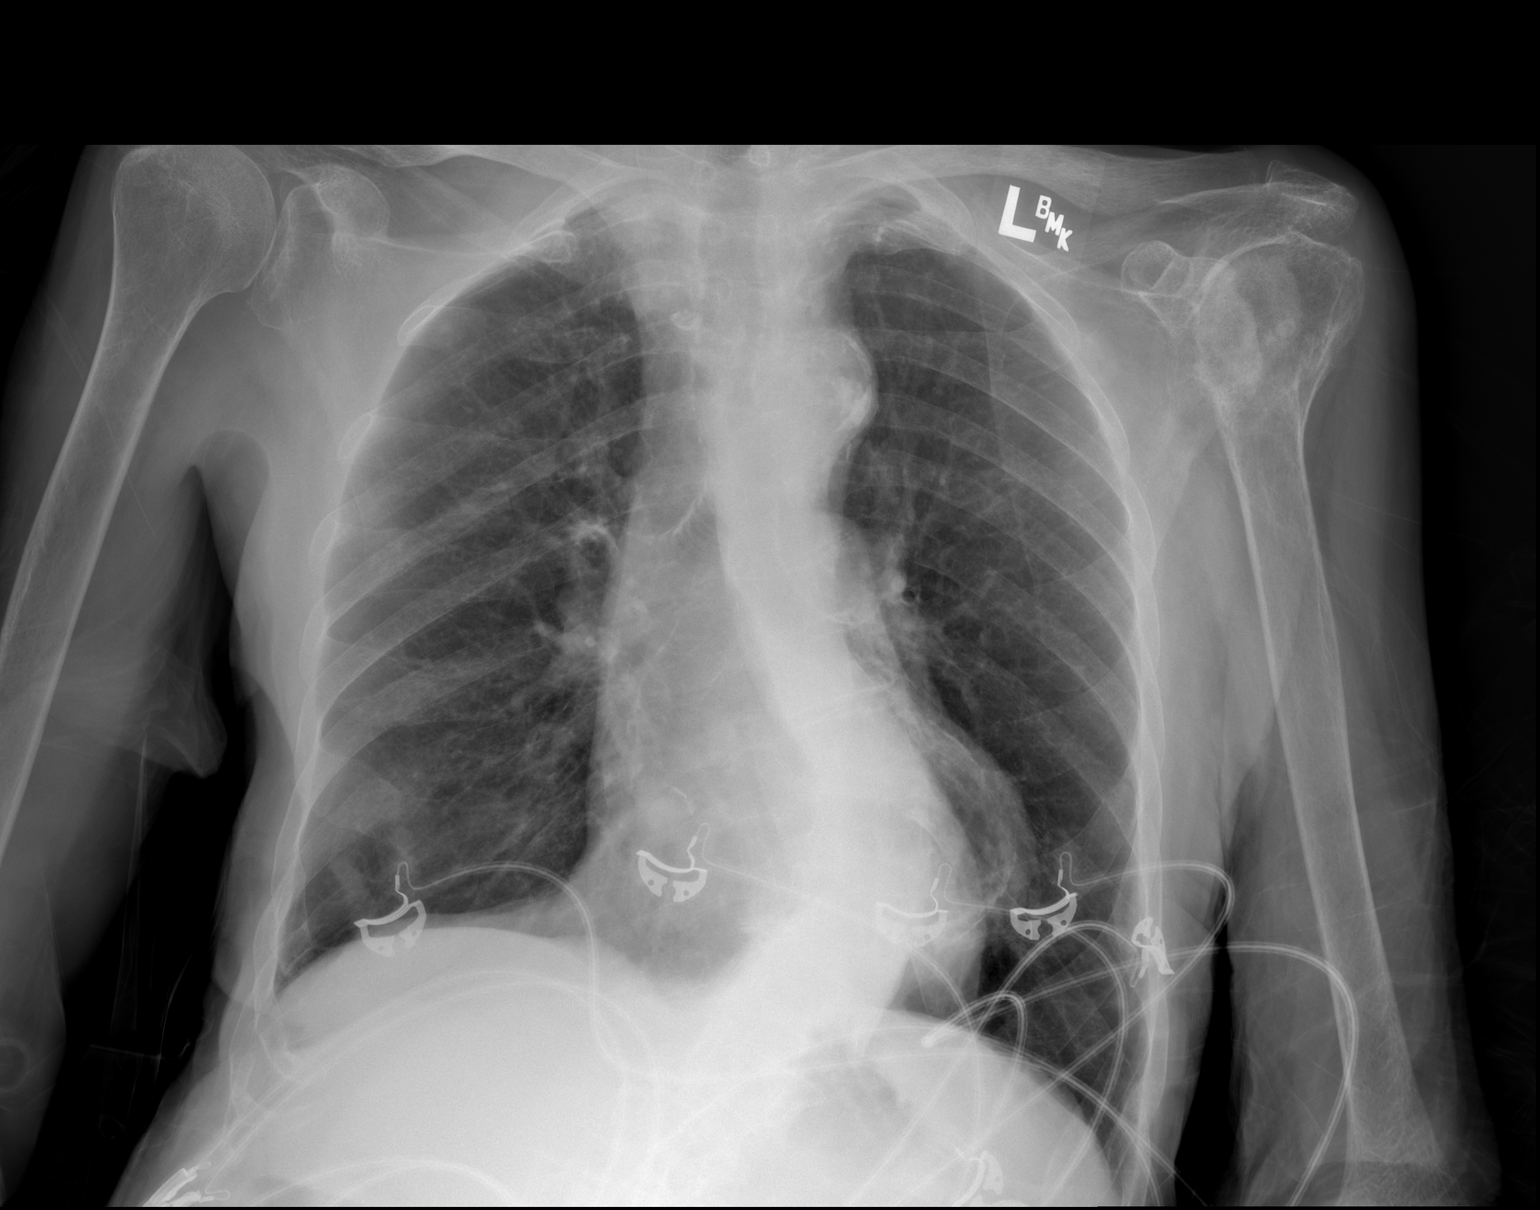
[im 2/2]
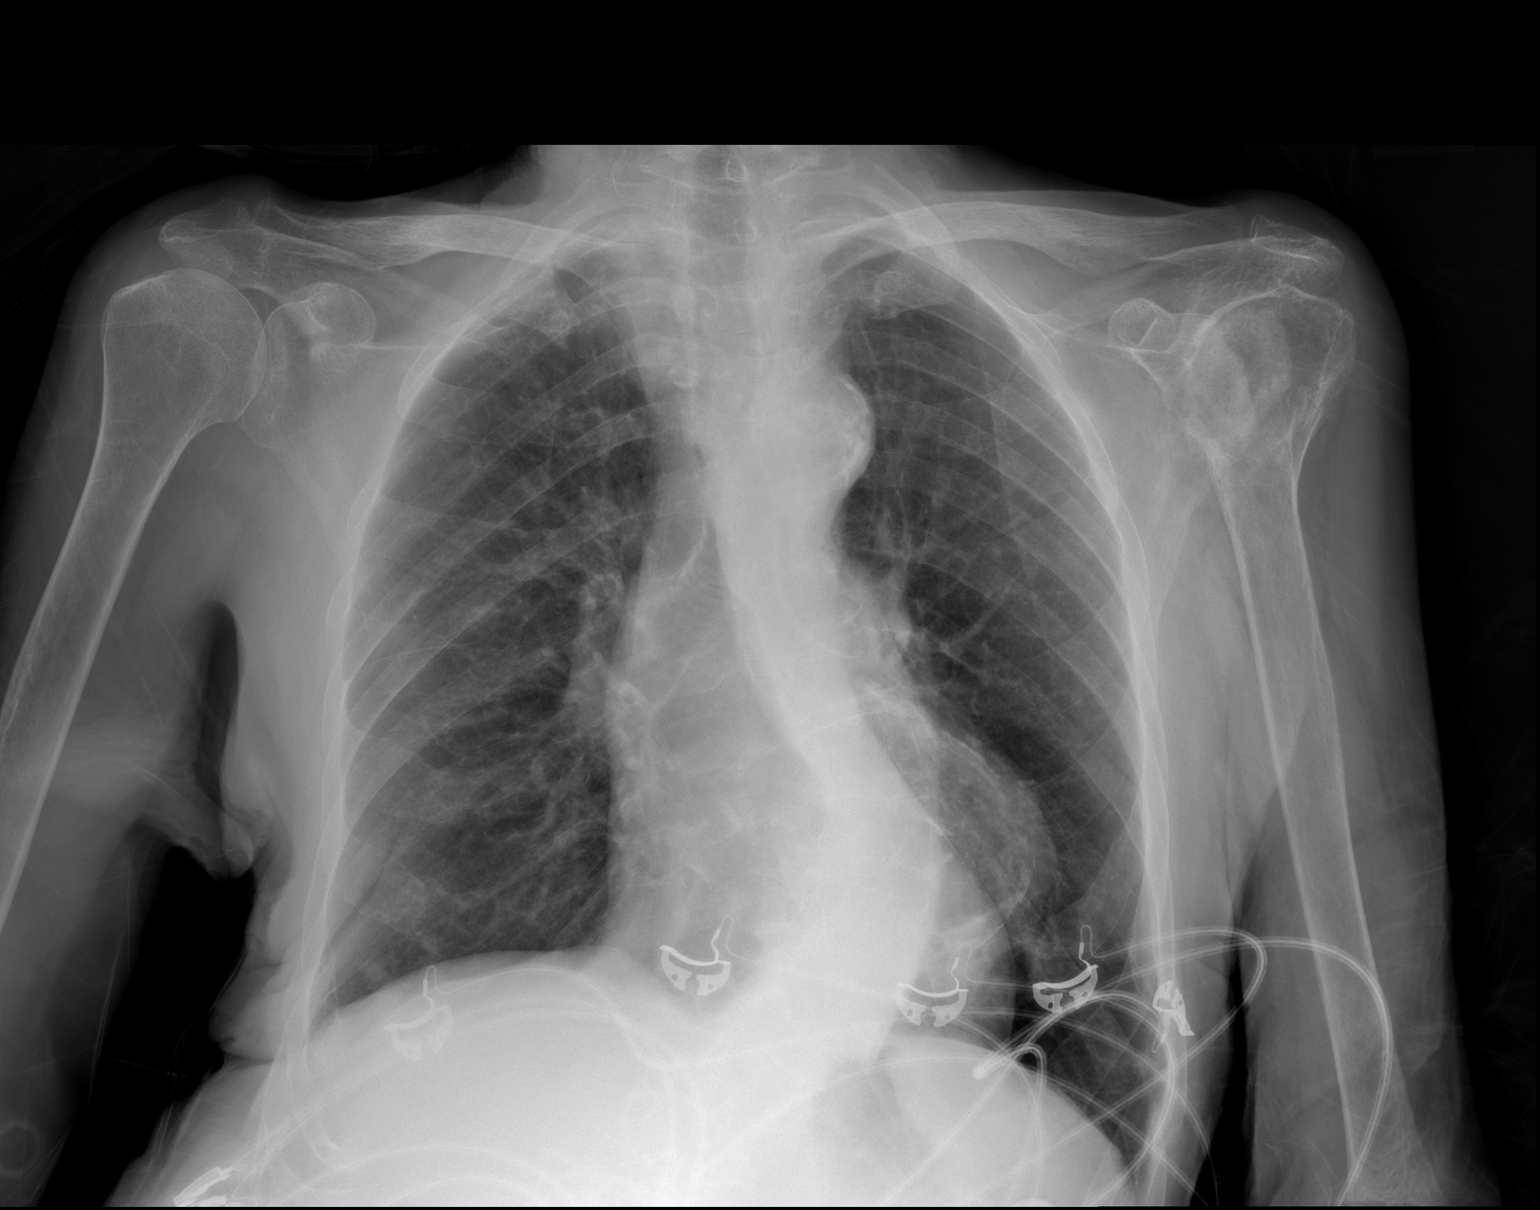

[2 of 2 positions shown; findings below may reference images not displayed]

FINDINGS: Normal heart size and pulmonary vascularity. Thoracolumbar scoliosis
convex towards the left. Calcified and tortuous aorta. Emphysematous
changes in the lungs. No focal airspace disease or consolidation. No
blunting of costophrenic angles. No pneumothorax. Old fracture
deformity and degenerative changes in the proximal left humerus.
IMPRESSION: No active disease.

## 2016-12-21 IMAGING — CT CT ANGIO CHEST
2 of 6 series · 18 of 46 positions shown · IV contrast (APPLIED)
Comparison: PET-CT 04/05/2011

CLINICAL DATA: Unwitnessed fall. Decreased oxygen saturation.
History of breast cancer post mastectomy and x-ray therapy
bilaterally.

EXAM:
CT ANGIOGRAPHY CHEST WITH CONTRAST
TECHNIQUE: Multidetector CT imaging of the chest was performed using the
standard protocol during bolus administration of intravenous
contrast. Multiplanar CT image reconstructions and MIPs were
obtained to evaluate the vascular anatomy.
CONTRAST:  100 mL Isovue 370

[Series 6: pe thins 1.5 · axial · 0.68mm/px · z∈[-265,-20]mm · 15 of 228 slices shown]
[im 12/228  lung]
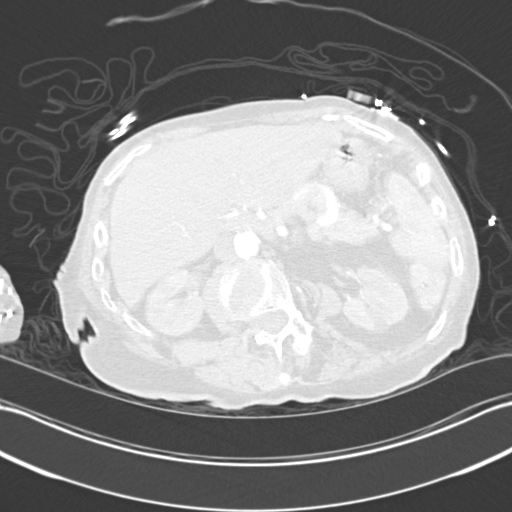
[im 24/228  soft-tissue]
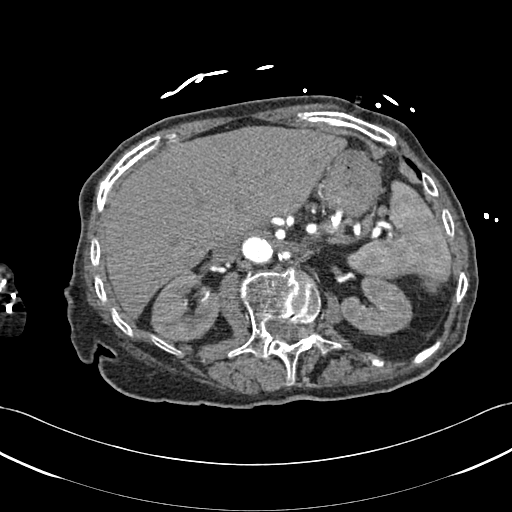
[im 48/228  lung]
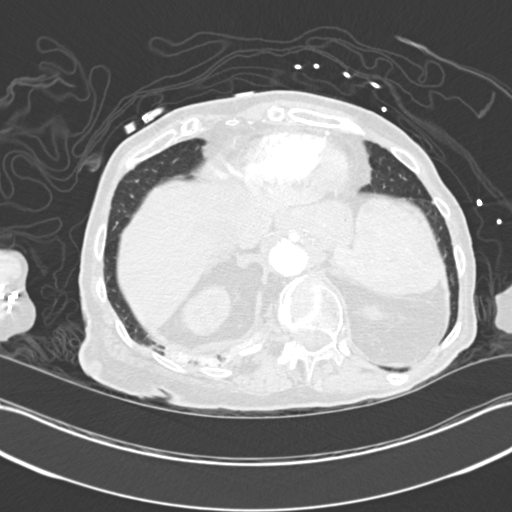
[im 60/228  soft-tissue]
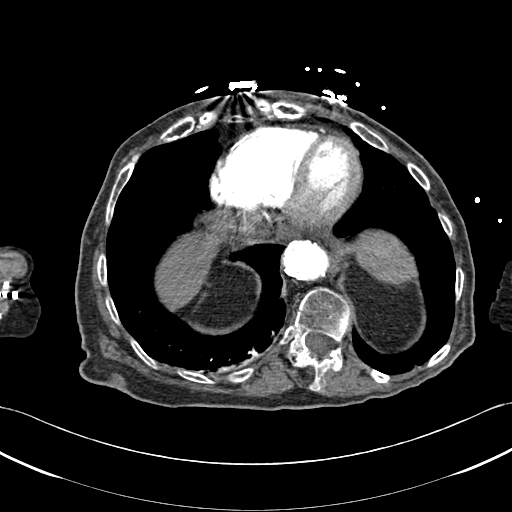
[im 72/228  lung]
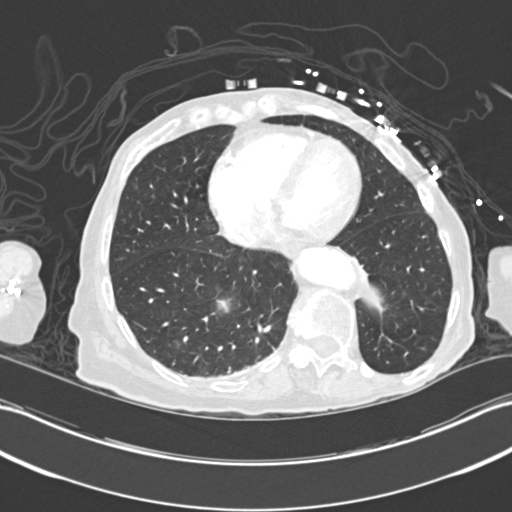
[im 84/228  soft-tissue]
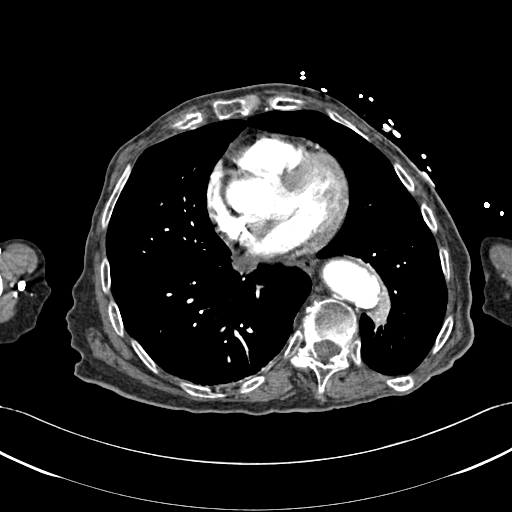
[im 96/228  lung]
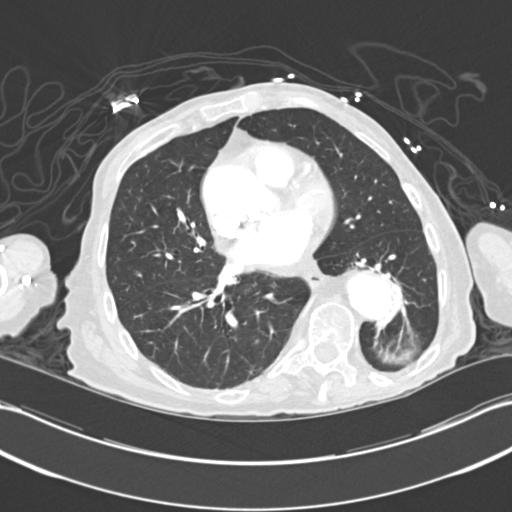
[im 120/228  soft-tissue]
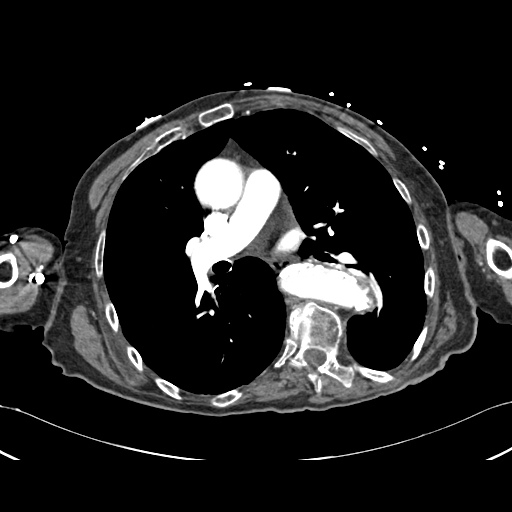
[im 132/228  lung]
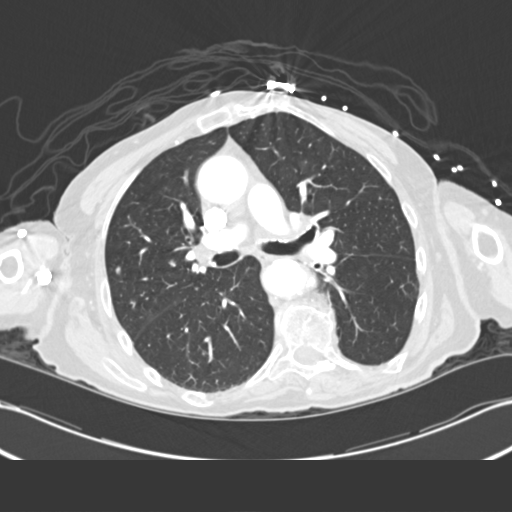
[im 144/228  soft-tissue]
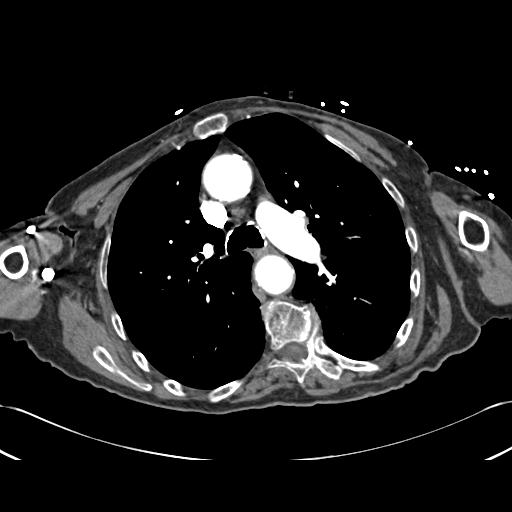
[im 156/228  lung]
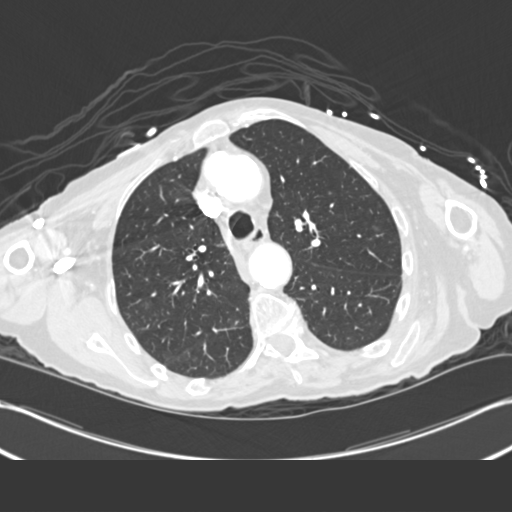
[im 168/228  soft-tissue]
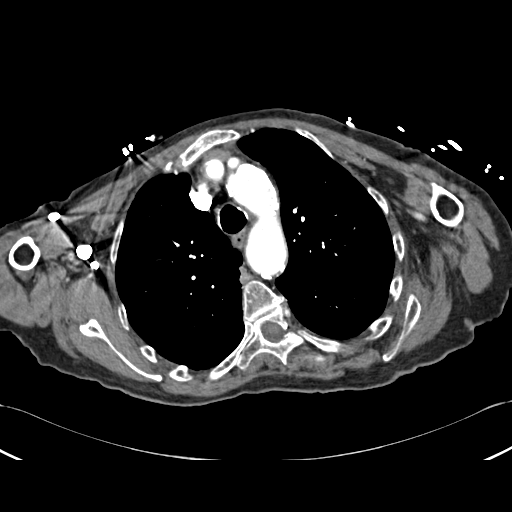
[im 192/228  lung]
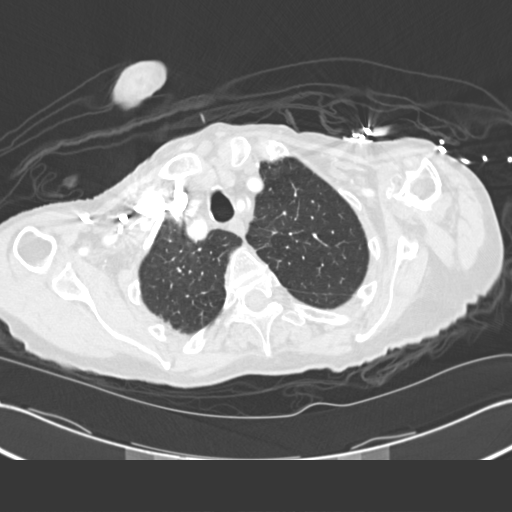
[im 204/228  soft-tissue]
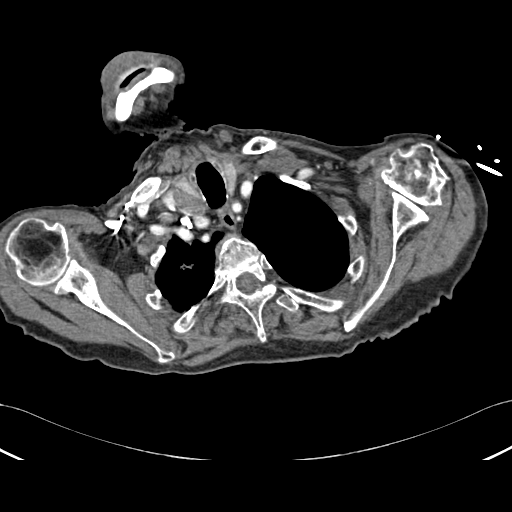
[im 216/228  lung]
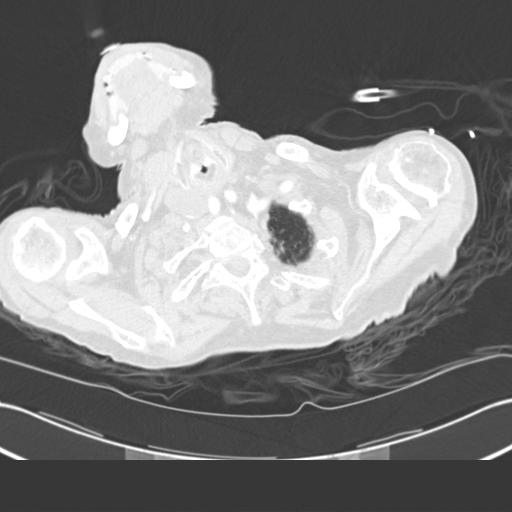

[Series 8: cor mpr 2.0 · coronal · 0.57mm/px · 3 of 116 slices shown]
[im 29/116  soft-tissue]
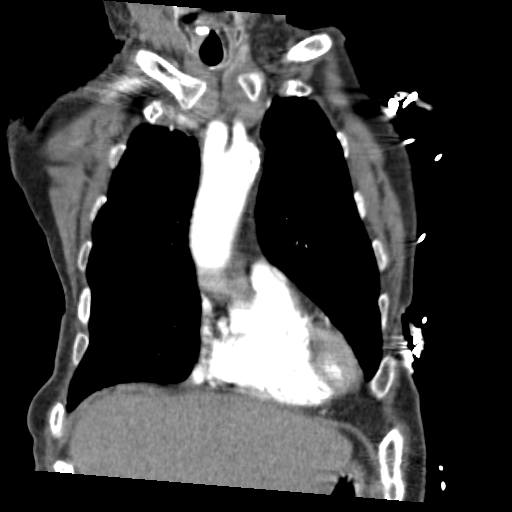
[im 58/116  soft-tissue]
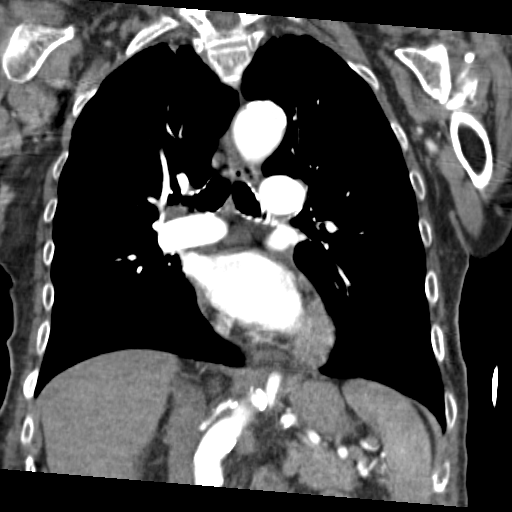
[im 87/116  soft-tissue]
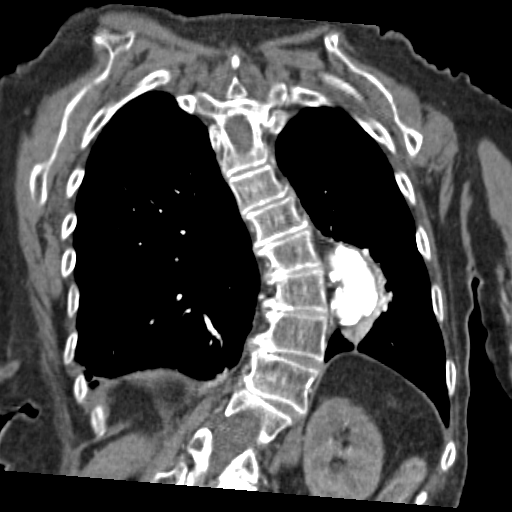

[18 of 46 positions shown; findings below may reference images not displayed]

FINDINGS: Technically adequate study with good opacification of the central
and segmental pulmonary arteries. No focal filling defects
demonstrated. No evidence of significant pulmonary embolus.

Normal heart size. Normal caliber thoracic aorta. Calcification in
the thoracic aorta and coronary arteries. Tortuous thoracic aorta.
Great vessel origins are patent. Esophagus is decompressed.
Mediastinal lymph nodes are not pathologically enlarged.

Emphysematous changes in the lungs. Atelectasis in the lung bases.
Multiple bilateral small pulmonary nodules and focal nodular
ground-glass infiltrative areas in both lungs. Largest nodular area
measures about 12 mm in diameter and is present in the right upper
lung. These changes appear to be new since the previous study. They
could represent inflammatory nodules or metastatic lesions. Suggest
follow-up in 3 months for further evaluation.Airways appear patent.
No pleural effusions. No pneumothorax.

Included portions of the upper abdominal organs demonstrate a
probable solid mass in the upper pole of the left kidney measuring
about 1.7 cm diameter. This appears to been present on the previous
PET-CT scan but has enlarged in the interval. In the left upper
quadrant posterior to the body of the pancreas, there is a poorly
defined at 1.7 x 3.6 cm soft tissue structure. This is incompletely
included within the field of view. This could represent bowel loop
but is somewhat attic position. A mass or enlarged lymph node should
be excluded. Suggest follow-up with contrast-enhanced CT for further
evaluation of these findings. Thoracolumbar scoliosis with
degenerative changes. No destructive bone lesions.

Review of the MIP images confirms the above findings.
IMPRESSION: No evidence of significant pulmonary embolus.

Multiple bilateral pulmonary nodules, many with ground-glass
appearance. These could represent inflammatory or metastatic lesions
and follow-up in 3 months is suggested.

There appears to be an enlarging solid nodule in the upper pole of
the left kidney and there is nonspecific soft tissue process
demonstrated in the left upper quadrant. CT abdomen and pelvis
suggested for further evaluation.
